# Patient Record
Sex: Female | Born: 1970 | Race: Black or African American | Hispanic: No | Marital: Single | Smoking: Never smoker
Health system: Southern US, Community
[De-identification: ages and names within clinical notes are randomized; demographics above are authoritative.]

## PROBLEM LIST (undated history)

## (undated) DIAGNOSIS — K2270 Barrett's esophagus without dysplasia: Secondary | ICD-10-CM

## (undated) DIAGNOSIS — Z5189 Encounter for other specified aftercare: Secondary | ICD-10-CM

## (undated) DIAGNOSIS — A599 Trichomoniasis, unspecified: Secondary | ICD-10-CM

## (undated) DIAGNOSIS — T7840XA Allergy, unspecified, initial encounter: Secondary | ICD-10-CM

## (undated) DIAGNOSIS — E119 Type 2 diabetes mellitus without complications: Secondary | ICD-10-CM

## (undated) DIAGNOSIS — Z9109 Other allergy status, other than to drugs and biological substances: Secondary | ICD-10-CM

## (undated) DIAGNOSIS — E876 Hypokalemia: Secondary | ICD-10-CM

## (undated) DIAGNOSIS — D509 Iron deficiency anemia, unspecified: Principal | ICD-10-CM

## (undated) DIAGNOSIS — I10 Essential (primary) hypertension: Secondary | ICD-10-CM

## (undated) DIAGNOSIS — D571 Sickle-cell disease without crisis: Secondary | ICD-10-CM

## (undated) DIAGNOSIS — D219 Benign neoplasm of connective and other soft tissue, unspecified: Secondary | ICD-10-CM

## (undated) DIAGNOSIS — K219 Gastro-esophageal reflux disease without esophagitis: Secondary | ICD-10-CM

## (undated) HISTORY — DX: Barrett's esophagus without dysplasia: K22.70

## (undated) HISTORY — DX: Trichomoniasis, unspecified: A59.9

## (undated) HISTORY — DX: Sickle-cell disease without crisis: D57.1

## (undated) HISTORY — PX: NASAL SEPTUM SURGERY: SHX37

## (undated) HISTORY — PX: TONSILLECTOMY AND ADENOIDECTOMY: SUR1326

## (undated) HISTORY — PX: POLYPECTOMY: SHX149

## (undated) HISTORY — DX: Hypokalemia: E87.6

## (undated) HISTORY — DX: Allergy, unspecified, initial encounter: T78.40XA

## (undated) HISTORY — DX: Benign neoplasm of connective and other soft tissue, unspecified: D21.9

## (undated) HISTORY — PX: CHOLECYSTECTOMY: SHX55

## (undated) HISTORY — PX: UPPER GASTROINTESTINAL ENDOSCOPY: SHX188

## (undated) HISTORY — DX: Gastro-esophageal reflux disease without esophagitis: K21.9

## (undated) HISTORY — PX: PALATE / UVULA BIOPSY / EXCISION: SUR128

## (undated) HISTORY — DX: Encounter for other specified aftercare: Z51.89

## (undated) HISTORY — DX: Other allergy status, other than to drugs and biological substances: Z91.09

## (undated) HISTORY — DX: Iron deficiency anemia, unspecified: D50.9

## (undated) HISTORY — PX: COLONOSCOPY: SHX174

---

## 2005-05-18 DIAGNOSIS — J302 Other seasonal allergic rhinitis: Secondary | ICD-10-CM | POA: Insufficient documentation

## 2006-09-13 DIAGNOSIS — G4700 Insomnia, unspecified: Secondary | ICD-10-CM | POA: Insufficient documentation

## 2008-11-29 DIAGNOSIS — Z79899 Other long term (current) drug therapy: Secondary | ICD-10-CM | POA: Insufficient documentation

## 2012-11-07 DIAGNOSIS — Z862 Personal history of diseases of the blood and blood-forming organs and certain disorders involving the immune mechanism: Secondary | ICD-10-CM | POA: Insufficient documentation

## 2012-12-07 DIAGNOSIS — G8929 Other chronic pain: Secondary | ICD-10-CM | POA: Insufficient documentation

## 2012-12-07 DIAGNOSIS — I10 Essential (primary) hypertension: Secondary | ICD-10-CM | POA: Insufficient documentation

## 2013-05-05 DIAGNOSIS — E785 Hyperlipidemia, unspecified: Secondary | ICD-10-CM | POA: Insufficient documentation

## 2013-06-06 ENCOUNTER — Encounter (HOSPITAL_COMMUNITY): Payer: Self-pay | Admitting: Emergency Medicine

## 2013-06-06 ENCOUNTER — Emergency Department (HOSPITAL_COMMUNITY)
Admission: EM | Admit: 2013-06-06 | Discharge: 2013-06-07 | Disposition: A | Discharge status: Home / Self Care | Payer: BC Managed Care – PPO | Attending: Emergency Medicine | Admitting: Emergency Medicine

## 2013-06-06 DIAGNOSIS — R109 Unspecified abdominal pain: Secondary | ICD-10-CM

## 2013-06-06 DIAGNOSIS — Z3202 Encounter for pregnancy test, result negative: Secondary | ICD-10-CM | POA: Insufficient documentation

## 2013-06-06 DIAGNOSIS — R1012 Left upper quadrant pain: Secondary | ICD-10-CM | POA: Insufficient documentation

## 2013-06-06 DIAGNOSIS — E119 Type 2 diabetes mellitus without complications: Secondary | ICD-10-CM | POA: Insufficient documentation

## 2013-06-06 DIAGNOSIS — I10 Essential (primary) hypertension: Secondary | ICD-10-CM | POA: Insufficient documentation

## 2013-06-06 DIAGNOSIS — D649 Anemia, unspecified: Secondary | ICD-10-CM

## 2013-06-06 DIAGNOSIS — E876 Hypokalemia: Secondary | ICD-10-CM

## 2013-06-06 DIAGNOSIS — Z79899 Other long term (current) drug therapy: Secondary | ICD-10-CM | POA: Insufficient documentation

## 2013-06-06 HISTORY — DX: Essential (primary) hypertension: I10

## 2013-06-06 HISTORY — DX: Type 2 diabetes mellitus without complications: E11.9

## 2013-06-06 LAB — CBC WITH DIFFERENTIAL/PLATELET
Basophils Relative: 0 % (ref 0–1)
Eosinophils Relative: 1 % (ref 0–5)
Hemoglobin: 8.1 g/dL — ABNORMAL LOW (ref 12.0–15.0)
Lymphocytes Relative: 15 % (ref 12–46)
Neutrophils Relative %: 78 % — ABNORMAL HIGH (ref 43–77)
RBC: 4.65 MIL/uL (ref 3.87–5.11)

## 2013-06-06 LAB — COMPREHENSIVE METABOLIC PANEL
ALT: 11 U/L (ref 0–35)
AST: 16 U/L (ref 0–37)
CO2: 28 mEq/L (ref 19–32)
Chloride: 98 mEq/L (ref 96–112)
GFR calc non Af Amer: >90 mL/min (ref >90)
Sodium: 139 mEq/L (ref 135–145)
Total Bilirubin: 0.6 mg/dL (ref 0.3–1.2)

## 2013-06-06 LAB — URINE MICROSCOPIC-ADD ON

## 2013-06-06 LAB — URINALYSIS, ROUTINE W REFLEX MICROSCOPIC
Bilirubin Urine: NEGATIVE
Protein, ur: 30 mg/dL — AB
Specific Gravity, Urine: 1.014 (ref 1.005–1.030)
Urobilinogen, UA: 0.2 mg/dL (ref 0.0–1.0)

## 2013-06-06 MED ORDER — ONDANSETRON HCL 4 MG/2ML IJ SOLN
4.0000 mg | Freq: Once | INTRAMUSCULAR | Status: AC
  Administered 2013-06-07: 4 mg via INTRAVENOUS
  Filled 2013-06-06: qty 2

## 2013-06-06 MED ORDER — MORPHINE SULFATE 4 MG/ML IJ SOLN
4.0000 mg | Freq: Once | INTRAMUSCULAR | Status: AC
  Administered 2013-06-07: 4 mg via INTRAVENOUS
  Filled 2013-06-06: qty 1

## 2013-06-06 MED ORDER — POTASSIUM CHLORIDE CRYS ER 20 MEQ PO TBCR
40.0000 meq | EXTENDED_RELEASE_TABLET | Freq: Once | ORAL | Status: AC
  Administered 2013-06-07: 40 meq via ORAL
  Filled 2013-06-06: qty 2

## 2013-06-06 NOTE — ED Notes (Signed)
Acuity increased due to Hemoglobin of 8.1 and Potassium of 2.6

## 2013-06-06 NOTE — ED Notes (Signed)
Pt.reports LLQ pain with with nausea and constipation onset 4 days ago , denies emesis or diarrhea , no fever or chills.

## 2013-06-07 ENCOUNTER — Emergency Department (HOSPITAL_COMMUNITY): Payer: BC Managed Care – PPO

## 2013-06-07 MED ORDER — DICYCLOMINE HCL 20 MG PO TABS: 20.0000 mg | ORAL_TABLET | Freq: Four times a day (QID) | ORAL | Status: DC | PRN

## 2013-06-07 MED ORDER — ONDANSETRON 4 MG PO TBDP: 4.0000 mg | ORAL_TABLET | Freq: Three times a day (TID) | ORAL | Status: DC | PRN

## 2013-06-07 MED ORDER — IOHEXOL 300 MG/ML  SOLN
25.0000 mL | INTRAMUSCULAR | Status: AC
  Administered 2013-06-07: 25 mL via ORAL

## 2013-06-07 MED ORDER — IOHEXOL 300 MG/ML  SOLN
100.0000 mL | Freq: Once | INTRAMUSCULAR | Status: AC | PRN
  Administered 2013-06-07: 100 mL via INTRAVENOUS

## 2013-06-07 MED ORDER — POTASSIUM CHLORIDE ER 10 MEQ PO TBCR: 20.0000 meq | EXTENDED_RELEASE_TABLET | Freq: Two times a day (BID) | ORAL | Status: DC

## 2013-06-07 NOTE — ED Notes (Signed)
Patient presents with a 5 day hx of left sided abdominal pain. Denies any diarrhea, constipation, nausea or vomiting. Patient states that it is not abnormal to have pain during her menses, in which she is currently at the end of, however; this pain is different. Pain occurs intermittently. Denies any fevers, sweats or chills. No hx abdominal surgery

## 2013-06-07 NOTE — ED Notes (Signed)
Patient reports hx sickle cell trait and low potassium. States that a HgB of 8 is normal for her.

## 2013-06-07 NOTE — ED Notes (Signed)
Patient transported to CT 

## 2013-06-07 NOTE — ED Provider Notes (Addendum)
CSN: [id number]     Arrival date & time 06/06/13  2113 History   First MD Initiated Contact with Patient 06/06/13 2258     Chief Complaint  Patient presents with  . Abdominal Pain   (Consider location/radiation/quality/duration/timing/severity/associated sxs/prior Treatment) HPI 42 yo female presents to the ER from home with complaint of left sided abdominal pain for 5 days.  Pain is intermittent, sharp, and crampy.  Pain is not like her menstrual cramps, is more severe and does not improve with ibuprofen.  Pt reports she has chronic nausea, intermittent diarrhea.  She has had some problems with having BM for the last few days, but today had firm formed bm, though she did have to strain some.  No fevers, chills, vomiting.  Pt reports past history of ovarian cyst.  She is currently on her menses.  No prior abdominal surgeries.  Past Medical History  Diagnosis Date  . Hypertension   . Diabetes mellitus without complication    History reviewed. No pertinent past surgical history. No family history on file. History  Substance Use Topics  . Smoking status: Never Smoker   . Smokeless tobacco: Not on file  . Alcohol Use: No   OB History   Grav Para Term Preterm Abortions TAB SAB Ect Mult Living                 Review of Systems  All other systems reviewed and are negative.    Allergies  Review of patient's allergies indicates no known allergies.  Home Medications   Current Outpatient Rx  Name  Route  Sig  Dispense  Refill  . cetirizine (ZYRTEC) 10 MG tablet   Oral   Take 10 mg by mouth daily.         . hydrochlorothiazide (HYDRODIURIL) 12.5 MG tablet   Oral   Take 12.5 mg by mouth daily.         Marland Kitchen ibuprofen (ADVIL,MOTRIN) 800 MG tablet   Oral   Take 800 mg by mouth every 8 (eight) hours as needed for pain.         . metFORMIN (GLUCOPHAGE) 500 MG tablet   Oral   Take 500 mg by mouth daily with breakfast.         . simvastatin (ZOCOR) 10 MG tablet   Oral  Take 10 mg by mouth daily.          BP 115/62  Pulse 90  Temp(Src) 99.6 F (37.6 C) (Oral)  Resp 14  SpO2 96%  LMP 05/31/2013 Physical Exam  Nursing note and vitals reviewed. Constitutional: She is oriented to person, place, and time. She appears well-developed and well-nourished.  HENT:  Head: Normocephalic and atraumatic.  Nose: Nose normal.  Mouth/Throat: Oropharynx is clear and moist.  Eyes: Conjunctivae and EOM are normal. Pupils are equal, round, and reactive to light.  Neck: Normal range of motion. Neck supple. No JVD present. No tracheal deviation present. No thyromegaly present.  Cardiovascular: Normal rate, regular rhythm, normal heart sounds and intact distal pulses.  Exam reveals no gallop and no friction rub.   No murmur heard. Pulmonary/Chest: Effort normal and breath sounds normal. No stridor. No respiratory distress. She has no wheezes. She has no rales. She exhibits no tenderness.  Abdominal: Soft. Bowel sounds are normal. She exhibits no distension and no mass. There is tenderness (LUQ, Left mid abd tenderness, no rebound, guarding). There is no rebound and no guarding.  Musculoskeletal: Normal range of motion. She exhibits no  edema and no tenderness.  Lymphadenopathy:    She has no cervical adenopathy.  Neurological: She is alert and oriented to person, place, and time. She has normal reflexes. No cranial nerve deficit. She exhibits normal muscle tone. Coordination normal.  Skin: Skin is warm and dry. No rash noted. No erythema. No pallor.  Psychiatric: She has a normal mood and affect. Her behavior is normal. Judgment and thought content normal.    ED Course  Procedures (including critical care time) Labs Review Labs Reviewed  CBC WITH DIFFERENTIAL - Abnormal; Notable for the following:    WBC 12.6 (*)    Hemoglobin 8.1 (*)    HCT 28.5 (*)    MCV 61.3 (*)    MCH 17.4 (*)    MCHC 28.4 (*)    RDW 17.5 (*)    Platelets 508 (*)    Neutrophils Relative %  78 (*)    Neutro Abs 9.8 (*)    All other components within normal limits  COMPREHENSIVE METABOLIC PANEL - Abnormal; Notable for the following:    Potassium 2.6 (*)    Glucose, Bld 168 (*)    All other components within normal limits  URINALYSIS, ROUTINE W REFLEX MICROSCOPIC - Abnormal; Notable for the following:    Hgb urine dipstick MODERATE (*)    Protein, ur 30 (*)    All other components within normal limits  URINE MICROSCOPIC-ADD ON  POCT PREGNANCY, URINE   Imaging Review Ct Abdomen Pelvis W Contrast  06/07/2013   *RADIOLOGY REPORT*  Clinical Data: Left-sided abdominal pain and leukocytosis.  Concern for colitis.  CT ABDOMEN AND PELVIS WITH CONTRAST  Technique:  Multidetector CT imaging of the abdomen and pelvis was performed following the standard protocol during bolus administration of intravenous contrast.  Contrast: OMNIPAQUE IOHEXOL 300 MG/ML  SOLN  Comparison: 01/09/2008.  Findings:  BODY WALL: Unremarkable.  LOWER CHEST:  Mediastinum: Unremarkable.  Lungs/pleura: No consolidation.  ABDOMEN/PELVIS:  Liver: No focal abnormality.  Biliary: Cholelithiasis.  No evidence of acute cholecystitis.  Pancreas: Unremarkable.  Spleen: Unremarkable.  Adrenals: Unremarkable.  Kidneys and ureters: No hydronephrosis or stone.  Bladder: Unremarkable.  Bowel: No obstruction. Normal appendix.  Retroperitoneum: No mass or adenopathy.  Peritoneum: No free fluid or gas.  Reproductive: 2cm predominantly hyperenhancing mass in the ventral uterus ( which is displaced to the right and the) most consistent with fibroid.  Symmetrically sized ovaries.  Vascular: No acute abnormality.  OSSEOUS: No acute abnormalities. No suspicious lytic or blastic lesions.  IMPRESSION: 1. No acute intra-abdominal findings.  No evidence for colitis. 2.  Cholelithiasis without acute cholecystitis. 3.  Small uterine fibroid.   Original Report Authenticated By: Tiburcio Pea    MDM   1. Abdominal pain   2. Anemia   3.  Hypokalemia     42 yo female with 5 days left-sided abd pain.  No pelvic pain on exam.  Slight elevation in wbc (pt reports chronic anemia and hypokalemia).  Concern for colitis/diverticulitis, will get ct abd pelvis.  2:22 AM No specific cause for abdominal pain found on CT scan.  Will prescribe bentyl, have patient f/u with pcm.  Olivia Mackie, MD 06/07/13 4098  Olivia Mackie, MD 06/07/13 [phone]

## 2013-09-08 DIAGNOSIS — G8929 Other chronic pain: Secondary | ICD-10-CM | POA: Insufficient documentation

## 2013-09-08 DIAGNOSIS — J45909 Unspecified asthma, uncomplicated: Secondary | ICD-10-CM | POA: Insufficient documentation

## 2013-09-18 ENCOUNTER — Telehealth: Payer: Self-pay | Admitting: Hematology and Oncology

## 2013-09-18 NOTE — Telephone Encounter (Signed)
C/D 09/18/13 for appt. 10/03/13

## 2013-10-03 ENCOUNTER — Telehealth: Payer: Self-pay | Admitting: *Deleted

## 2013-10-03 ENCOUNTER — Ambulatory Visit (HOSPITAL_BASED_OUTPATIENT_CLINIC_OR_DEPARTMENT_OTHER): Payer: BC Managed Care – PPO | Admitting: Hematology and Oncology

## 2013-10-03 ENCOUNTER — Encounter: Payer: Self-pay | Admitting: Hematology and Oncology

## 2013-10-03 ENCOUNTER — Ambulatory Visit: Payer: BC Managed Care – PPO

## 2013-10-03 ENCOUNTER — Telehealth: Payer: Self-pay | Admitting: Hematology and Oncology

## 2013-10-03 ENCOUNTER — Ambulatory Visit (HOSPITAL_BASED_OUTPATIENT_CLINIC_OR_DEPARTMENT_OTHER): Payer: BC Managed Care – PPO

## 2013-10-03 VITALS — BP 137/68 | HR 88 | Temp 98.3°F | Resp 20 | Ht 65.0 in | Wt 226.3 lb

## 2013-10-03 DIAGNOSIS — D509 Iron deficiency anemia, unspecified: Secondary | ICD-10-CM | POA: Insufficient documentation

## 2013-10-03 DIAGNOSIS — D508 Other iron deficiency anemias: Secondary | ICD-10-CM

## 2013-10-03 DIAGNOSIS — D473 Essential (hemorrhagic) thrombocythemia: Secondary | ICD-10-CM

## 2013-10-03 DIAGNOSIS — E876 Hypokalemia: Secondary | ICD-10-CM

## 2013-10-03 HISTORY — DX: Iron deficiency anemia, unspecified: D50.9

## 2013-10-03 HISTORY — DX: Hypokalemia: E87.6

## 2013-10-03 LAB — BASIC METABOLIC PANEL (CC13)
CO2: 26 mEq/L (ref 22–29)
Chloride: 98 mEq/L (ref 98–109)
Potassium: 2.9 mEq/L — CL (ref 3.5–5.1)
Sodium: 139 mEq/L (ref 136–145)

## 2013-10-03 LAB — CBC & DIFF AND RETIC
BASO%: 0.4 % (ref 0.0–2.0)
HCT: 30.1 % — ABNORMAL LOW (ref 34.8–46.6)
Immature Retic Fract: 15.8 % — ABNORMAL HIGH (ref 1.60–10.00)
MCHC: 29.3 g/dL — ABNORMAL LOW (ref 31.5–36.0)
MONO#: 0.7 10*3/uL (ref 0.1–0.9)
NEUT%: 76.6 % (ref 38.4–76.8)
RDW: 18.8 % — ABNORMAL HIGH (ref 11.2–14.5)
Retic %: 1.42 % (ref 0.70–2.10)
Retic Ct Abs: 73.27 10*3/uL (ref 33.70–90.70)
WBC: 11.4 10*3/uL — ABNORMAL HIGH (ref 3.9–10.3)
lymph#: 1.6 10*3/uL (ref 0.9–3.3)

## 2013-10-03 LAB — IRON AND TIBC CHCC: Iron: 23 ug/dL — ABNORMAL LOW (ref 41–142)

## 2013-10-03 LAB — SEDIMENTATION RATE: Sed Rate: 13 mm/hr (ref 0–22)

## 2013-10-03 LAB — HOLD TUBE, BLOOD BANK

## 2013-10-03 MED ORDER — POTASSIUM CHLORIDE CRYS ER 20 MEQ PO TBCR: 20.0000 meq | EXTENDED_RELEASE_TABLET | Freq: Two times a day (BID) | ORAL | Status: DC

## 2013-10-03 NOTE — Progress Notes (Signed)
[facility] CONSULT NOTE  Patient Care Team: Pcp Not In System as PCP - General  CHIEF COMPLAINTS/PURPOSE OF CONSULTATION:  Severe, chronic iron deficiency anemia, unable to tolerate oral iron supplement  HISTORY OF PRESENTING ILLNESS:  Priscilla Solis 42 y.o. female is here because of severe iron deficiency anemia  She was found to have abnormal CBC from routine physical. She denies recent chest pain on exertion, shortness of breath on minimal exertion, pre-syncopal episodes, or palpitations. She does complain of profound fatigue and takes naps all the time She had not noticed any recent bleeding such as epistaxis, hematuria or hematochezia The patient takes over the counter NSAID ingestion usually around the time of her menstrual cycle to treat premenstrual cramping. She is not on antiplatelets agents. She had no prior history or diagnosis of cancer. Her age appropriate screening programs are up-to-date. She has significant pica and chews a lot of ice regularly "by the pound". She eats a variety of diet. She never donated blood or received blood transfusion The patient was prescribed oral iron supplements and she takes them in the past but it caused significant constipation and she refuse to take further iron supplement the patient continues to have regular menstruation. Previously she has excessive bleeding but recently menstrual period has been light. She had 1 pregnancy and was told she was anemic during pregnancy  MEDICAL HISTORY:  Past Medical History  Diagnosis Date  . Hypertension   . Diabetes mellitus without complication   . Sickle cell anemia     sickle cell trait  . Microcytic anemia 10/03/2013    SURGICAL HISTORY: Past Surgical History  Procedure Laterality Date  . Tonsillectomy and adenoidectomy    . Nasal septum surgery      SOCIAL HISTORY: History   Social History  . Marital Status: Single    Spouse Name: N/A    Number of Children: N/A  .  Years of Education: N/A   Occupational History  . Not on file.   Social History Main Topics  . Smoking status: Never Smoker   . Smokeless tobacco: Never Used  . Alcohol Use: No  . Drug Use: No  . Sexual Activity: Not on file   Other Topics Concern  . Not on file   Social History Narrative  . No narrative on file    FAMILY HISTORY: Family History  Problem Relation Age of Onset  . Cancer Mother     Non invasive breast ca  . Cancer Cousin     ovarian cancer    ALLERGIES:  has No Known Allergies.  MEDICATIONS:  Current Outpatient Prescriptions  Medication Sig Dispense Refill  . cetirizine (ZYRTEC) 10 MG tablet Take 10 mg by mouth daily.      . hydrochlorothiazide (HYDRODIURIL) 12.5 MG tablet Take 12.5 mg by mouth daily.      Marland Kitchen ibuprofen (ADVIL,MOTRIN) 800 MG tablet Take 800 mg by mouth every 8 (eight) hours as needed for pain.      . metFORMIN (GLUCOPHAGE) 500 MG tablet Take 500 mg by mouth daily with breakfast.      . simvastatin (ZOCOR) 10 MG tablet Take 10 mg by mouth daily.      Marland Kitchen atenolol-chlorthalidone (TENORETIC) 50-25 MG per tablet       . dicyclomine (BENTYL) 20 MG tablet Take 1 tablet (20 mg total) by mouth every 6 (six) hours as needed (for abdominal cramping).  20 tablet  0  . ondansetron (ZOFRAN ODT) 4 MG disintegrating tablet  Take 1 tablet (4 mg total) by mouth every 8 (eight) hours as needed for nausea.  20 tablet  0  . oxyCODONE-acetaminophen (PERCOCET) 10-325 MG per tablet       . potassium chloride (K-DUR) 10 MEQ tablet Take 2 tablets (20 mEq total) by mouth 2 (two) times daily.  30 tablet  0  . ranitidine (ZANTAC) 300 MG tablet        No current facility-administered medications for this visit.    REVIEW OF SYSTEMS:   Constitutional: Denies fevers, chills or abnormal night sweats Eyes: Denies blurriness of vision, double vision or watery eyes Ears, nose, mouth, throat, and face: Denies mucositis or sore throat Respiratory: Denies cough, dyspnea or  wheezes Cardiovascular: Denies palpitation, chest discomfort or lower extremity swelling Gastrointestinal:  Denies nausea, heartburn or change in bowel habits Skin: Denies abnormal skin rashes Lymphatics: Denies new lymphadenopathy or easy bruising Neurological:Denies numbness, tingling or new weaknesses Behavioral/Psych: Mood is stable, no new changes  All other systems were reviewed with the patient and are negative.  PHYSICAL EXAMINATION: ECOG PERFORMANCE STATUS: 1 - Symptomatic but completely ambulatory  Filed Vitals:   10/03/13 0915  BP: 137/68  Pulse: 88  Temp: 98.3 F (36.8 C)  Resp: 20   Filed Weights   10/03/13 0915  Weight: 226 lb 4.8 oz (102.649 kg)    GENERAL:alert, no distress and comfortable. She is morbidly obese  SKIN: skin color is pale, texture, turgor are normal, no rashes or significant lesions EYES: normal, conjunctiva are  pale and non-injected, sclera clear OROPHARYNX:no exudate, no erythema and lips, buccal mucosa, and tongue normal  NECK: supple, thyroid normal size, non-tender, without nodularity LYMPH:  no palpable lymphadenopathy in the cervical, axillary or inguinal LUNGS: clear to auscultation and percussion with normal breathing effort HEART: regular rate & rhythm and no murmurs and no lower extremity edema ABDOMEN:abdomen soft, non-tender and normal bowel sounds Musculoskeletal:no cyanosis of digits and no clubbing  PSYCH: alert & oriented x 3 with fluent speech NEURO: no focal motor/sensory deficits  LABORATORY DATA:  I have reviewed the data as listed. Her last hemoglobin was in September which shows she is severely anemic.  ASSESSMENT:  Severe iron deficiency anemia  PLAN #1 Anemia The cause of her anemia is due to severe iron deficiency, likely due to history of severe menorrhagia. The patient cannot tolerate oral iron supplement due to severe side effects. I ordered a stat CBC in my office today which show her hemoglobin is greater  than 8 g and she does not require blood transfusion We discussed some of the risks, benefits, and alternatives of intravenous iron infusions. The patient is symptomatic from anemia and the iron level is critically low. She tolerated oral iron supplement poorly and desires to achieved higher levels of iron faster for adequate hematopoesis. Some of the side-effects to be expected including risks of infusion reactions, phlebitis, headaches, nausea and fatigue.  The patient is willing to proceed. Patient education material was dispensed.  Goal is to keep ferritin level greater than 50 I will see her back in about a month. If her ferritin improves to normal, and I plan to see her again to follow and hopefully her hemoglobin does not drop further and her ferritin level stays up. If despite iron infusion she continue to have persistent iron deficiency, she may need a colonoscopy and EGD in the near future. I rather hold off until she has received adequate iron replacement. Her most recent CT scan of  the abdomen was negative. #2 thrombocytosis This is due to his severe iron deficiency. We will monitor for now. #3 hypokalemia This is likely due to her recent diuretic treatment. I will replace it with oral supplement.

## 2013-10-03 NOTE — Telephone Encounter (Signed)
gv and printed appt sched and avs for pt for DEc and Jan sed add tx....sent pt to lab

## 2013-10-03 NOTE — Telephone Encounter (Signed)
Message copied by Rolanda Jay on Tue Oct 03, 2013 11:29 AM ------      Message from: [facility], [place]      Created: Tue Oct 03, 2013 11:06 AM      Regarding: hypokalemia       I e scribed K supplement to her pharmacy      Please call and let her know      She also need to discuss with her PCP about long term K supplement       ----- Message -----         From: [facility]         Sent: 10/03/2013  10:28 AM           To: Artis Delay, MD                   ------

## 2013-10-03 NOTE — Telephone Encounter (Signed)
Informed pt of low Potassium level and need for replacement,  rx sent to [facility] for pt to take 20 Meq twice daily.   Pt states not currently taking any potassium and verbalized understanding of taking new rx and will also f/u w/ PCP for low potassium.  Faxed office note and labs from today to Dr. Weston Brass at fax #[phone].

## 2013-10-06 ENCOUNTER — Other Ambulatory Visit: Payer: Self-pay | Admitting: Hematology and Oncology

## 2013-10-06 ENCOUNTER — Ambulatory Visit (HOSPITAL_BASED_OUTPATIENT_CLINIC_OR_DEPARTMENT_OTHER): Payer: BC Managed Care – PPO

## 2013-10-06 VITALS — BP 116/72 | HR 84 | Temp 99.0°F | Resp 20

## 2013-10-06 DIAGNOSIS — D509 Iron deficiency anemia, unspecified: Secondary | ICD-10-CM

## 2013-10-06 MED ORDER — SODIUM CHLORIDE 0.9 % IV SOLN
1020.0000 mg | Freq: Once | INTRAVENOUS | Status: AC
  Administered 2013-10-06: 1020 mg via INTRAVENOUS
  Filled 2013-10-06: qty 34

## 2013-10-06 MED ORDER — SODIUM CHLORIDE 0.9 % IV SOLN
Freq: Once | INTRAVENOUS | Status: AC
  Administered 2013-10-06: 09:00:00 via INTRAVENOUS

## 2013-10-06 NOTE — Patient Instructions (Signed)

## 2013-10-09 ENCOUNTER — Telehealth: Payer: Self-pay | Admitting: Hematology and Oncology

## 2013-10-09 NOTE — Telephone Encounter (Signed)
I spoke with the patient today. She is complaining of nausea and dry heaves which I suspect was due to potassium supplement. I recommend the patient to stop the potassium supplement and to take fruit juices which is typically rich with potassium. She is a complaining of some low-grade temperature and hot flashes which is suspect is not related to iron infusion. I reassured the patient.

## 2013-10-24 ENCOUNTER — Ambulatory Visit (HOSPITAL_BASED_OUTPATIENT_CLINIC_OR_DEPARTMENT_OTHER): Payer: BC Managed Care – PPO | Admitting: Hematology and Oncology

## 2013-10-24 ENCOUNTER — Encounter: Payer: Self-pay | Admitting: Hematology and Oncology

## 2013-10-24 ENCOUNTER — Telehealth: Payer: Self-pay | Admitting: *Deleted

## 2013-10-24 ENCOUNTER — Other Ambulatory Visit (HOSPITAL_BASED_OUTPATIENT_CLINIC_OR_DEPARTMENT_OTHER): Payer: BC Managed Care – PPO

## 2013-10-24 VITALS — BP 128/70 | HR 76 | Temp 97.1°F | Resp 18 | Ht 65.0 in | Wt 230.7 lb

## 2013-10-24 DIAGNOSIS — E876 Hypokalemia: Secondary | ICD-10-CM

## 2013-10-24 DIAGNOSIS — N920 Excessive and frequent menstruation with regular cycle: Secondary | ICD-10-CM

## 2013-10-24 DIAGNOSIS — I809 Phlebitis and thrombophlebitis of unspecified site: Secondary | ICD-10-CM

## 2013-10-24 DIAGNOSIS — D509 Iron deficiency anemia, unspecified: Secondary | ICD-10-CM

## 2013-10-24 LAB — CBC & DIFF AND RETIC
BASO%: 0.1 % (ref 0.0–2.0)
Basophils Absolute: 0 10*3/uL (ref 0.0–0.1)
EOS ABS: 0.2 10*3/uL (ref 0.0–0.5)
EOS%: 1.8 % (ref 0.0–7.0)
HEMATOCRIT: 37.2 % (ref 34.8–46.6)
HEMOGLOBIN: 11.2 g/dL — AB (ref 11.6–15.9)
IMMATURE RETIC FRACT: 16.2 % — AB (ref 1.60–10.00)
LYMPH#: 1.6 10*3/uL (ref 0.9–3.3)
LYMPH%: 17.7 % (ref 14.0–49.7)
MCH: 21.2 pg — ABNORMAL LOW (ref 25.1–34.0)
MCHC: 30.1 g/dL — AB (ref 31.5–36.0)
MCV: 70.3 fL — ABNORMAL LOW (ref 79.5–101.0)
MONO#: 0.6 10*3/uL (ref 0.1–0.9)
MONO%: 6.7 % (ref 0.0–14.0)
NEUT%: 73.7 % (ref 38.4–76.8)
NEUTROS ABS: 6.6 10*3/uL — AB (ref 1.5–6.5)
PLATELETS: 411 10*3/uL — AB (ref 145–400)
RBC: 5.29 10*6/uL (ref 3.70–5.45)
RDW: 29.1 % — ABNORMAL HIGH (ref 11.2–14.5)
RETIC CT ABS: 129.61 10*3/uL — AB (ref 33.70–90.70)
Retic %: 2.45 % — ABNORMAL HIGH (ref 0.70–2.10)
WBC: 9 10*3/uL (ref 3.9–10.3)
nRBC: 0 % (ref 0–0)

## 2013-10-24 LAB — BASIC METABOLIC PANEL (CC13)
Anion Gap: 14 mEq/L — ABNORMAL HIGH (ref 3–11)
BUN: 14.7 mg/dL (ref 7.0–26.0)
CO2: 27 meq/L (ref 22–29)
Calcium: 9.6 mg/dL (ref 8.4–10.4)
Chloride: 100 mEq/L (ref 98–109)
Creatinine: 0.9 mg/dL (ref 0.6–1.1)
Glucose: 169 mg/dl — ABNORMAL HIGH (ref 70–140)
POTASSIUM: 3 meq/L — AB (ref 3.5–5.1)
SODIUM: 141 meq/L (ref 136–145)

## 2013-10-24 LAB — FERRITIN CHCC: FERRITIN: 93 ng/mL (ref 9–269)

## 2013-10-24 NOTE — Telephone Encounter (Signed)
Message copied by Cathlean Cower on Tue Oct 24, 2013  2:37 PM ------      Message from: [facility], [place]: Tue Oct 24, 2013 12:47 PM      Regarding: test results       Please inform pt      1) ferritin OK, no need iron until next visit      2) Low potassium: still low, need further work-up with PCP. Recommend she still take the Potassium supplement I prescribed last week      ----- Message -----         From: [facility]         Sent: 10/24/2013   9:00 AM           To: Heath Lark, MD                   ------

## 2013-10-24 NOTE — Progress Notes (Signed)
[place], MD DIAGNOSIS:  Severe iron deficiency anemia and thrombocytosis due to menorrhagia  SUMMARY OF HEMATOLOGIC HISTORY: This is a pleasant 43 year old lady who is being referred here because of severe iron deficiency anemia and intolerance to oral iron supplement. The cause of her iron deficiency anemia is likely due to excessive menstruation from uterine fibroids INTERVAL HISTORY: Priscilla Solis 43 y.o. female returns for  further followup. She tolerated intravenous iron well. Pica is improving. She is in the process of getting an appointment to see her gynecologist for management of menorrhagia. She complained of tenderness in the right upper arm at previous IV sites  I have reviewed the past medical history, past surgical history, social history and family history with the patient and they are unchanged from previous note.  ALLERGIES:  has No Known Allergies.  MEDICATIONS:  Current Outpatient Prescriptions  Medication Sig Dispense Refill  . atenolol-chlorthalidone (TENORETIC) 50-25 MG per tablet       . cetirizine (ZYRTEC) 10 MG tablet Take 10 mg by mouth daily.      . hydrochlorothiazide (HYDRODIURIL) 12.5 MG tablet Take 12.5 mg by mouth daily.      Marland Kitchen ibuprofen (ADVIL,MOTRIN) 800 MG tablet Take 800 mg by mouth every 8 (eight) hours as needed for pain.      . metFORMIN (GLUCOPHAGE) 500 MG tablet Take 500 mg by mouth daily with breakfast.      . oxyCODONE-acetaminophen (PERCOCET) 10-325 MG per tablet       . potassium chloride SA (K-DUR,KLOR-CON) 20 MEQ tablet Take 1 tablet (20 mEq total) by mouth 2 (two) times daily.  14 tablet  0  . ranitidine (ZANTAC) 300 MG tablet       . simvastatin (ZOCOR) 10 MG tablet Take 10 mg by mouth daily.       No current facility-administered medications for this visit.     REVIEW OF SYSTEMS:   Behavioral/Psych: Mood is stable, no new changes  All other systems were reviewed with the  patient and are negative.  PHYSICAL EXAMINATION: ECOG PERFORMANCE STATUS: 0 - Asymptomatic  Filed Vitals:   10/24/13 0853  BP: 128/70  Pulse: 76  Temp: 97.1 F (36.2 C)  Resp: 18   Filed Weights   10/24/13 0853  Weight: 230 lb 11.2 oz (104.645 kg)    GENERAL:alert, no distress and comfortable Examination of her right upper extremity reveals some mild swelling and tenderness in the superficial vein likely represents superficial thrombophlebitis LABORATORY DATA:  I have reviewed the data as listed Results for orders placed in visit on 10/24/13 (from the past 48 hour(s))  CBC & DIFF AND RETIC     Status: Abnormal   Collection Time    10/24/13  8:41 AM      Result Value Range   WBC 9.0  3.9 - 10.3 10e3/uL   NEUT# 6.6 (*) 1.5 - 6.5 10e3/uL   HGB 11.2 (*) 11.6 - 15.9 g/dL   HCT 37.2  34.8 - 46.6 %   Platelets 411 (*) 145 - 400 10e3/uL   MCV 70.3 (*) 79.5 - 101.0 fL   MCH 21.2 (*) 25.1 - 34.0 pg   MCHC 30.1 (*) 31.5 - 36.0 g/dL   RBC 5.29  3.70 - 5.45 10e6/uL   RDW 29.1 (*) 11.2 - 14.5 %   lymph# 1.6  0.9 - 3.3 10e3/uL   MONO# 0.6  0.1 - 0.9 10e3/uL   Eosinophils Absolute 0.2  0.0 -  0.5 10e3/uL   Basophils Absolute 0.0  0.0 - 0.1 10e3/uL   NEUT% 73.7  38.4 - 76.8 %   LYMPH% 17.7  14.0 - 49.7 %   MONO% 6.7  0.0 - 14.0 %   EOS% 1.8  0.0 - 7.0 %   BASO% 0.1  0.0 - 2.0 %   nRBC 0  0 - 0 %   Retic % 2.45 (*) 0.70 - 2.10 %   Retic Ct Abs 129.61 (*) 33.70 - 90.70 10e3/uL   Immature Retic Fract 16.20 (*) 1.60 - 10.00 %    Lab Results  Component Value Date   WBC 9.0 10/24/2013   HGB 11.2* 10/24/2013   HCT 37.2 10/24/2013   MCV 70.3* 10/24/2013   PLT 411* 10/24/2013  ASSESSMENT & PLAN:  #1 severe iron deficiency anemia This is due to heavy menstruation. I recommend her to followup with a gynecologist for management of this. Her anemia is improving. Ferritin level is pending. I prefer to get her ferritin level above 100 if possible and I will call the patient's results. If  further iron infusion is needed I will set it up. However if further iron infusion is not  needed, I will bring her back in my office in 6 months with repeat blood work and iron studies. #2 menorrhagia I would defer to her gynecologist for further management #3 superficial thrombophlebitis I recommend conservative management and observation only #4 hypokalemia The potassium supplement was making her sick. She has discontinue both hydrochlorothiazide and potassium supplement. Result from today is still pending and I will call the patient will test results. In the meantime I recommend she stay away from her diuretics as I suspect that is the most likely cause of her hypokalemia. All questions were answered. The patient knows to call the clinic with any problems, questions or concerns. No barriers to learning was detected.  I spent 15 minutes counseling the patient face to face. The total time spent in the appointment was 20 minutes and more than 50% was on counseling.     [place], [place], MD 10/24/2013 9:16 AM

## 2013-10-24 NOTE — Telephone Encounter (Signed)
Left pt VM requesting she call nurse for message (see below) from Dr. Alvy Bimler.

## 2013-10-25 ENCOUNTER — Telehealth: Payer: Self-pay | Admitting: Hematology and Oncology

## 2013-10-25 NOTE — Telephone Encounter (Signed)
Informed pt of Dr. Calton Dach note that Ferritin is ok and no need for iron until next visit.   Also informed of Potassium still low and to continue potassium supplement as prescribed and to f/u w/ PCP regarding low potassium level.  Pt verbalized understanding.

## 2013-10-25 NOTE — Telephone Encounter (Signed)
Pt left VM this morning returning nurse's call.  Called pt back and left VM for pt asking her to return call again.

## 2013-10-25 NOTE — Telephone Encounter (Signed)
lmonvm for pt re appts for 7/15 and 7/22. schedule mailed.

## 2014-04-18 ENCOUNTER — Telehealth: Payer: Self-pay | Admitting: *Deleted

## 2014-04-18 ENCOUNTER — Other Ambulatory Visit (HOSPITAL_BASED_OUTPATIENT_CLINIC_OR_DEPARTMENT_OTHER): Payer: BC Managed Care – PPO

## 2014-04-18 DIAGNOSIS — D509 Iron deficiency anemia, unspecified: Secondary | ICD-10-CM

## 2014-04-18 DIAGNOSIS — E876 Hypokalemia: Secondary | ICD-10-CM

## 2014-04-18 LAB — CBC & DIFF AND RETIC
BASO%: 0.1 % (ref 0.0–2.0)
Basophils Absolute: 0 10*3/uL (ref 0.0–0.1)
EOS%: 3.4 % (ref 0.0–7.0)
Eosinophils Absolute: 0.4 10*3/uL (ref 0.0–0.5)
HEMATOCRIT: 33 % — AB (ref 34.8–46.6)
HGB: 10.2 g/dL — ABNORMAL LOW (ref 11.6–15.9)
Immature Retic Fract: 11.7 % — ABNORMAL HIGH (ref 1.60–10.00)
LYMPH%: 17.8 % (ref 14.0–49.7)
MCH: 21.3 pg — AB (ref 25.1–34.0)
MCHC: 30.9 g/dL — AB (ref 31.5–36.0)
MCV: 68.9 fL — ABNORMAL LOW (ref 79.5–101.0)
MONO#: 0.8 10*3/uL (ref 0.1–0.9)
MONO%: 6.9 % (ref 0.0–14.0)
NEUT#: 8.3 10*3/uL — ABNORMAL HIGH (ref 1.5–6.5)
NEUT%: 71.8 % (ref 38.4–76.8)
Platelets: 346 10*3/uL (ref 145–400)
RBC: 4.79 10*6/uL (ref 3.70–5.45)
RDW: 16.6 % — AB (ref 11.2–14.5)
RETIC CT ABS: 85.74 10*3/uL (ref 33.70–90.70)
Retic %: 1.79 % (ref 0.70–2.10)
WBC: 11.5 10*3/uL — ABNORMAL HIGH (ref 3.9–10.3)
lymph#: 2 10*3/uL (ref 0.9–3.3)
nRBC: 0 % (ref 0–0)

## 2014-04-18 LAB — BASIC METABOLIC PANEL (CC13)
ANION GAP: 11 meq/L (ref 3–11)
BUN: 13.1 mg/dL (ref 7.0–26.0)
CO2: 27 mEq/L (ref 22–29)
Calcium: 9.2 mg/dL (ref 8.4–10.4)
Chloride: 102 mEq/L (ref 98–109)
Creatinine: 0.9 mg/dL (ref 0.6–1.1)
Glucose: 119 mg/dl (ref 70–140)
POTASSIUM: 2.8 meq/L — AB (ref 3.5–5.1)
Sodium: 140 mEq/L (ref 136–145)

## 2014-04-18 NOTE — Telephone Encounter (Signed)
Pt states she stopped taking Potassium one month ago because her PCP told her to stop.  She says her potassium was fine one month ago.  Informed pt it is low today at 2.8 and Dr. Alvy Bimler can prescribe her potassium.  Pt states still has potassium pills at home and she will start taking them again. Instructed her to follow up w/ her PCP to recheck w/i next month.  Also reviewed some foods high in potassium such as potatoes, orange juice, bananas, etc.. Pt verbalized understanding.

## 2014-04-18 NOTE — Telephone Encounter (Signed)
Message copied by Cathlean Cower on Wed Apr 18, 2014  4:15 PM ------      Message from: [facility], [place]      Created: Wed Apr 18, 2014  3:57 PM      Regarding: hypokalemia       Please call pt and make sure she takes K supplement      ----- Message -----         From: [facility]         Sent: 04/18/2014   3:21 PM           To: Heath Lark, MD                   ------

## 2014-04-19 ENCOUNTER — Telehealth: Payer: Self-pay | Admitting: *Deleted

## 2014-04-19 ENCOUNTER — Telehealth: Payer: Self-pay | Admitting: Hematology and Oncology

## 2014-04-19 ENCOUNTER — Other Ambulatory Visit: Payer: Self-pay | Admitting: *Deleted

## 2014-04-19 LAB — FERRITIN CHCC: Ferritin: 4 ng/ml — ABNORMAL LOW (ref 9–269)

## 2014-04-19 NOTE — Telephone Encounter (Signed)
Per new guidelines, her feraheme must be completed by 4 pm. Can she come in at 1 pm to see me and iv iron afterwards?

## 2014-04-19 NOTE — Telephone Encounter (Signed)
Left VM on cell phone asking that pt call Dr. Calton Dach nurse back regarding iron.

## 2014-04-19 NOTE — Telephone Encounter (Signed)
Message copied by Cathlean Cower on Thu Apr 19, 2014  2:38 PM ------      Message from: [facility], [place]: Thu Apr 19, 2014  9:16 AM      Regarding: needs IV iron       She will need IV iron again.      Can you ask if she can come sooner next week or another day so that I can see her and give IV iron same day? Would be iron feraheme      ----- Message -----         From: [facility]         Sent: 04/18/2014   3:21 PM           To: Heath Lark, MD                   ------

## 2014-04-19 NOTE — Telephone Encounter (Signed)
PER 7/16 POF CHANGED TIME OF 7/22 F/U TO 1PM AND ADDED INF. INTIALLY LM ON HOME PHONE BUT CALLED CELL AND S/W PT RE CHANGE.

## 2014-04-19 NOTE — Telephone Encounter (Signed)
Informed pt of low Ferritin level and IV iron recommended by Dr. Alvy Bimler early next week.  Pt states she can only come in on Wednesday,  Has day off work and asks for iron on Wed w/ office visit.  POF sent to add for IV Feraheme 7/22.    Pt states she thinks her iron is low because she was on her menstrual cycle and she complains her cycle gets even worse after taking iron.  Suggested pt discuss this w/ Dr. Alvy Bimler next week.  She verbalized understanding.

## 2014-04-25 ENCOUNTER — Ambulatory Visit (HOSPITAL_BASED_OUTPATIENT_CLINIC_OR_DEPARTMENT_OTHER): Payer: BC Managed Care – PPO | Admitting: Hematology and Oncology

## 2014-04-25 ENCOUNTER — Ambulatory Visit (HOSPITAL_BASED_OUTPATIENT_CLINIC_OR_DEPARTMENT_OTHER): Payer: BC Managed Care – PPO

## 2014-04-25 ENCOUNTER — Telehealth: Payer: Self-pay | Admitting: Hematology and Oncology

## 2014-04-25 ENCOUNTER — Encounter: Payer: Self-pay | Admitting: Hematology and Oncology

## 2014-04-25 VITALS — BP 119/75 | HR 101 | Temp 98.3°F

## 2014-04-25 VITALS — BP 119/70 | HR 80 | Temp 98.5°F | Resp 19 | Ht 66.0 in | Wt 243.3 lb

## 2014-04-25 DIAGNOSIS — E876 Hypokalemia: Secondary | ICD-10-CM

## 2014-04-25 DIAGNOSIS — D509 Iron deficiency anemia, unspecified: Secondary | ICD-10-CM

## 2014-04-25 MED ORDER — FERUMOXYTOL INJECTION 510 MG/17 ML
1020.0000 mg | Freq: Once | INTRAVENOUS | Status: AC
  Administered 2014-04-25: 1020 mg via INTRAVENOUS
  Filled 2014-04-25: qty 34

## 2014-04-25 MED ORDER — SODIUM CHLORIDE 0.9 % IJ SOLN
10.0000 mL | INTRAMUSCULAR | Status: DC | PRN
  Filled 2014-04-25: qty 10

## 2014-04-25 NOTE — Assessment & Plan Note (Signed)
She has recurrent iron deficiency anemia, likely due to menorrhagia. On questioning, she stated that she is not having any menorrhagia. She agrees to proceed with intravenous iron infusion today. I suggested GI evaluation for recurrent microcytic anemia. The patient wants to come back in a month to recheck her blood and to consider GI workup only if she has recurrence of iron deficiency. I think that is reasonable. I will order her hemoglobinopathy evaluation to exclude thalassemia as the cause of her microcytosis.

## 2014-04-25 NOTE — Progress Notes (Signed)
[place], [place] SUMMARY OF HEMATOLOGIC HISTORY: This is a pleasant 43 year old lady who is being referred here because of severe iron deficiency anemia and intolerance to oral iron supplement. The cause of her iron deficiency anemia is likely due to excessive menstruation from uterine fibroids. She received one dose of intravenous iron on 10/06/2013 INTERVAL HISTORY: Priscilla Solis 43 y.o. female returns for further followup. She denies excessive menstruation. The patient denies any recent signs or symptoms of bleeding such as spontaneous epistaxis, hematuria or hematochezia. She complained of fluid retention.  I have reviewed the past medical history, past surgical history, social history and family history with the patient and they are unchanged from previous note.  ALLERGIES:  has No Known Allergies.  MEDICATIONS:  Current Outpatient Prescriptions  Medication Sig Dispense Refill  . cetirizine (ZYRTEC) 10 MG tablet Take 10 mg by mouth daily.      . hydrochlorothiazide (HYDRODIURIL) 12.5 MG tablet Take 25 mg by mouth daily.       Marland Kitchen ibuprofen (ADVIL,MOTRIN) 800 MG tablet Take 800 mg by mouth every 8 (eight) hours as needed for pain.      Marland Kitchen losartan (COZAAR) 50 MG tablet Take 25 mg by mouth daily.      . metFORMIN (GLUCOPHAGE) 500 MG tablet Take 1,000 mg by mouth 2 (two) times daily with a meal.       . oxyCODONE-acetaminophen (PERCOCET) 10-325 MG per tablet       . potassium chloride SA (K-DUR,KLOR-CON) 20 MEQ tablet Take 20 mEq by mouth daily.      . ranitidine (ZANTAC) 300 MG tablet Take 300 mg by mouth at bedtime.       . simvastatin (ZOCOR) 10 MG tablet Take 10 mg by mouth daily.       No current facility-administered medications for this visit.     REVIEW OF SYSTEMS:   Constitutional: Denies fevers, chills or night sweats Eyes: Denies blurriness of vision Ears, nose, mouth, throat, and face: Denies mucositis or sore  throat Respiratory: Denies cough, dyspnea or wheezes Cardiovascular: Denies palpitation, chest discomfort or lower extremity swelling Gastrointestinal:  Denies nausea, heartburn or change in bowel habits Skin: Denies abnormal skin rashes Lymphatics: Denies new lymphadenopathy or easy bruising Neurological:Denies numbness, tingling or new weaknesses Behavioral/Psych: Mood is stable, no new changes  All other systems were reviewed with the patient and are negative.  PHYSICAL EXAMINATION: ECOG PERFORMANCE STATUS: 0 - Asymptomatic  Filed Vitals:   04/25/14 1322  BP: 119/70  Pulse: 80  Temp: 98.5 F (36.9 C)  Resp: 19   Filed Weights   04/25/14 1322  Weight: 243 lb 4.8 oz (110.36 kg)    GENERAL:alert, no distress and comfortable. She is morbidly obese SKIN: skin color, texture, turgor are normal, no rashes or significant lesions EYES: normal, Conjunctiva are pale and non-injected, sclera clear OROPHARYNX:no eMusculoskeletal:no cyanosis of digits and no clubbing  NEURO: alert & oriented x 3 with fluent speech, no focal motor/sensory deficits  LABORATORY DATA:  I have reviewed the data as listed No results found for this or any previous visit (from the past 48 hour(s)).  Lab Results  Component Value Date   WBC 11.5* 04/18/2014   HGB 10.2* 04/18/2014   HCT 33.0* 04/18/2014   MCV 68.9* 04/18/2014   PLT 346 04/18/2014    ASSESSMENT & PLAN:  Microcytic anemia She has recurrent iron deficiency anemia, likely due to menorrhagia. On questioning, she stated that she is  not having any menorrhagia. She agrees to proceed with intravenous iron infusion today. I suggested GI evaluation for recurrent microcytic anemia. The patient wants to come back in a month to recheck her blood and to consider GI workup only if she has recurrence of iron deficiency. I think that is reasonable. I will order her hemoglobinopathy evaluation to exclude thalassemia as the cause of her  microcytosis.   Hypokalemia This is due to her diuretic therapy. I recommend she discuss with her physician about prescribing another form of potassium sparing diuretic.    All questions were answered. The patient knows to call the clinic with any problems, questions or concerns. No barriers to learning was detected.  I spent 15 minutes counseling the patient face to face. The total time spent in the appointment was 20 minutes and more than 50% was on counseling.     [facility], [place], MD 04/25/2014 1:47 PM

## 2014-04-25 NOTE — Patient Instructions (Signed)

## 2014-04-25 NOTE — Assessment & Plan Note (Signed)
This is due to her diuretic therapy. I recommend she discuss with her physician about prescribing another form of potassium sparing diuretic.

## 2014-04-25 NOTE — Telephone Encounter (Signed)
Pt confirmed labs/ov per 07/22 POF, gave pt AVS...KJ °

## 2014-04-26 ENCOUNTER — Other Ambulatory Visit: Payer: Self-pay | Admitting: Hematology and Oncology

## 2014-04-26 ENCOUNTER — Telehealth: Payer: Self-pay | Admitting: *Deleted

## 2014-04-26 DIAGNOSIS — D509 Iron deficiency anemia, unspecified: Secondary | ICD-10-CM

## 2014-04-26 NOTE — Telephone Encounter (Signed)
Pt states she would like Dr Alvy Bimler to refer her to GI MD to rule GI bleed.

## 2014-04-26 NOTE — Telephone Encounter (Signed)
Order placed

## 2014-04-27 ENCOUNTER — Encounter: Payer: Self-pay | Admitting: Gastroenterology

## 2014-04-27 ENCOUNTER — Telehealth: Payer: Self-pay | Admitting: Hematology and Oncology

## 2014-04-27 NOTE — Telephone Encounter (Signed)
S.w. Pt and advised on 8.11 appt with PA Shane Crutch @# 9:30..the patient ok adn aware

## 2014-05-15 ENCOUNTER — Ambulatory Visit (INDEPENDENT_AMBULATORY_CARE_PROVIDER_SITE_OTHER): Payer: BC Managed Care – PPO | Admitting: Gastroenterology

## 2014-05-15 ENCOUNTER — Encounter: Payer: Self-pay | Admitting: Gastroenterology

## 2014-05-15 VITALS — BP 118/70 | HR 68 | Ht 65.0 in | Wt 238.4 lb

## 2014-05-15 DIAGNOSIS — D509 Iron deficiency anemia, unspecified: Secondary | ICD-10-CM

## 2014-05-15 DIAGNOSIS — K219 Gastro-esophageal reflux disease without esophagitis: Secondary | ICD-10-CM | POA: Insufficient documentation

## 2014-05-15 DIAGNOSIS — K625 Hemorrhage of anus and rectum: Secondary | ICD-10-CM

## 2014-05-15 MED ORDER — MOVIPREP 100 G PO SOLR: 1.0000 | Freq: Once | ORAL | Status: DC

## 2014-05-15 NOTE — Progress Notes (Addendum)
[id number] Abriella Filkins [id number] [birth year]   HISTORY OF PRESENT ILLNESS:  This is a 43 year old female who presents to our office today at the request of hematology, Dr. Alvy Bimler, in regards to [place].  Patient says that she has been anemic as long as she can remember.  Is still having menses but denies menorrhagia.  Has sickle cell trait.  Is intolerant to PO iron because it makes her sick.  She has received 2 iron infusions since December.  her most recent Hgb last month was 10.2 grams.  It had been as low as 8.1 grams just one year ago.  MCV is low at 68.9.  Ferritin is 4.  Other iron studies in 09/2013 indicated IDA as well.  Her stools have not been checked for blood.  She admits to seeing bright red blood on occasion with BM if she is constipated and has to strain.  No black stools.  She does take some ibuprofen prn, but usually just around the time of her cycle.  She admits to reflux issues for about 10 years or so and was previously on omeprazole and zantac.  She is now only taking zantac 300 mg at bedtime.  Overall it works ok for her, but she still gets some discomfort in her chest with reflux at times.     Past Medical History  Diagnosis Date  . Hypertension   . Diabetes mellitus without complication   . Sickle cell anemia     sickle cell trait  . Microcytic anemia 10/03/2013  . Hypokalemia 10/03/2013   Past Surgical History  Procedure Laterality Date  . Tonsillectomy and adenoidectomy    . Nasal septum surgery      reports that she has never smoked. She has never used smokeless tobacco. She reports that she does not drink alcohol or use illicit drugs. family history includes Cancer in her cousin and mother; Diabetes in her maternal aunt and maternal uncle. No Known Allergies    Outpatient Encounter Prescriptions as of 05/15/2014  Medication Sig  . budesonide-formoterol (SYMBICORT) 160-4.5 MCG/ACT inhaler Frequency:BID   Dosage:0.0     Instructions:  Note:Dose:  160-4.5MCG  . cetirizine (ZYRTEC) 10 MG tablet Take 10 mg by mouth daily.  . hydrochlorothiazide (HYDRODIURIL) 12.5 MG tablet Take 25 mg by mouth daily.   Marland Kitchen ibuprofen (ADVIL,MOTRIN) 800 MG tablet Take 800 mg by mouth every 8 (eight) hours as needed for pain.  Marland Kitchen losartan (COZAAR) 50 MG tablet Take 25 mg by mouth daily.  . metFORMIN (GLUCOPHAGE) 500 MG tablet Take 1,000 mg by mouth 2 (two) times daily with a meal.   . oxyCODONE-acetaminophen (PERCOCET) 10-325 MG per tablet   . potassium chloride SA (K-DUR,KLOR-CON) 20 MEQ tablet Take 20 mEq by mouth daily.  . ranitidine (ZANTAC) 300 MG tablet Take 300 mg by mouth at bedtime.   . simvastatin (ZOCOR) 10 MG tablet Take 10 mg by mouth daily.     REVIEW OF SYSTEMS  : All other systems reviewed and negative except where noted in the History of Present Illness.   PHYSICAL EXAM: BP 118/70  Pulse 68  Ht 5\' 5"  (1.651 m)  Wt 238 lb 6.4 oz (108.138 kg)  BMI 39.67 kg/m2  LMP 04/16/2014 General: Well developed black female in no acute distress Head: Normocephalic and atraumatic Eyes:  Sclerae anicteric, conjunctiva pink. Ears: Normal auditory acuity Lungs: Clear throughout to auscultation Heart: Regular rate and rhythm Abdomen: Soft, obese, non-distended.  Normal bowel sounds.  Non-tender. Rectal:  Deferred.  Will be done at the time of colonoscopy. Musculoskeletal: Symmetrical with no gross deformities  Skin: No lesions on visible extremities Extremities: No edema  Neurological: Alert oriented x 4, grossly non-focal Psychological:  Alert and cooperative. Normal mood and affect  ASSESSMENT AND PLAN: -IDA:  Has been anemia for several years (as long as she can remember).  Still having menses but not no menorrhagia.  Has sickle cell trait.  Intolerant to PO iron.  Has received 2 iron infusions since December.  Sees occasional bright red blood when straining to have a BM. -GERD:  Symptoms for about 10 years.  Currently only taking Zantac 300 mg  at bedtime.  *Will schedule EGD and colonoscopy to rule out GI source of IDA.  Will check stool for occult blood x 3.  If all is negative then likely no further evaluation from GI standpoint will be needed.  The risks, benefits, and alternatives were discussed with the patient and she consents to proceed.     Addendum: Reviewed and agree with initial management. Jerene Bears, MD

## 2014-05-15 NOTE — Addendum Note (Signed)
Addended by: Hope Pigeon A on: 05/15/2014 04:36 PM   Modules accepted: Orders

## 2014-05-15 NOTE — Patient Instructions (Signed)
You have been scheduled for an endoscopy and colonoscopy. Please follow the written instructions given to you at your visit today. Please pick up your prep at the pharmacy within the next 1-3 days. If you use inhalers (even only as needed), please bring them with you on the day of your procedure. Your physician has requested that you go to www.startemmi.com and enter the access code given to you at your visit today. This web site gives a general overview about your procedure. However, you should still follow specific instructions given to you by our office regarding your preparation for the procedure.  Hemoccult cards given to you today for you to do at home and mail back.

## 2014-05-21 ENCOUNTER — Ambulatory Visit (AMBULATORY_SURGERY_CENTER): Payer: BC Managed Care – PPO | Admitting: Internal Medicine

## 2014-05-21 ENCOUNTER — Encounter: Payer: Self-pay | Admitting: Internal Medicine

## 2014-05-21 VITALS — BP 128/61 | HR 81 | Temp 97.3°F | Resp 27 | Ht 65.0 in | Wt 238.0 lb

## 2014-05-21 DIAGNOSIS — D131 Benign neoplasm of stomach: Secondary | ICD-10-CM

## 2014-05-21 DIAGNOSIS — D129 Benign neoplasm of anus and anal canal: Secondary | ICD-10-CM

## 2014-05-21 DIAGNOSIS — D128 Benign neoplasm of rectum: Secondary | ICD-10-CM

## 2014-05-21 DIAGNOSIS — K2210 Ulcer of esophagus without bleeding: Secondary | ICD-10-CM

## 2014-05-21 DIAGNOSIS — K2990 Gastroduodenitis, unspecified, without bleeding: Secondary | ICD-10-CM

## 2014-05-21 DIAGNOSIS — D1330 Benign neoplasm of unspecified part of small intestine: Secondary | ICD-10-CM

## 2014-05-21 DIAGNOSIS — D126 Benign neoplasm of colon, unspecified: Secondary | ICD-10-CM

## 2014-05-21 DIAGNOSIS — D509 Iron deficiency anemia, unspecified: Secondary | ICD-10-CM

## 2014-05-21 DIAGNOSIS — K2970 Gastritis, unspecified, without bleeding: Secondary | ICD-10-CM

## 2014-05-21 LAB — GLUCOSE, CAPILLARY
Glucose-Capillary: 117 mg/dL — ABNORMAL HIGH (ref 70–99)
Glucose-Capillary: 105 mg/dL — ABNORMAL HIGH (ref 70–99)

## 2014-05-21 MED ORDER — PANTOPRAZOLE SODIUM 40 MG PO TBEC: 40.0000 mg | DELAYED_RELEASE_TABLET | Freq: Every day | ORAL | Status: DC

## 2014-05-21 MED ORDER — SODIUM CHLORIDE 0.9 % IV SOLN: 500.0000 mL | INTRAVENOUS | Status: DC

## 2014-05-21 NOTE — Op Note (Signed)
[place]  [address]. [place], [id number]   ENDOSCOPY PROCEDURE REPORT  PATIENT: Priscilla Solis, Priscilla Solis  MR#: [id number] BIRTHDATE: [birth year] , 43  yrs. old GENDER: Female ENDOSCOPIST: Jerene Bears, MD REFERRED BY:   Heath Lark, MD PROCEDURE DATE:  05/21/2014 PROCEDURE:  EGD w/ biopsy ASA CLASS:     Class II INDICATIONS:  Iron deficiency anemia. MEDICATIONS: MAC sedation, administered by CRNA and propofol (Diprivan) 250mg  IV TOPICAL ANESTHETIC: none  DESCRIPTION OF PROCEDURE: After the risks benefits and alternatives of the procedure were thoroughly explained, informed consent was obtained.  The LB [id number] endoscope was introduced through the mouth and advanced to the second portion of the duodenum. Without limitations.  The instrument was slowly withdrawn as the mucosa was fully examined.  ESOPHAGUS: An irregular Z-line was observed 40 cm from the incisors. Multiple biopsies were performed.   The esophagus was otherwise normal.  STOMACH: Moderate erosive gastritis (inflammation) was found in the gastric antrum.  Biopsies were taken in the antrum and angularis.   DUODENUM: The duodenal mucosa showed no abnormalities in the bulb and second portion of the duodenum.  Cold forceps biopsies were taken in the bulb and second portion to exclude celiac given IDA. Retroflexed views revealed no abnormalities.     The scope was then withdrawn from the patient and the procedure completed.  COMPLICATIONS: There were no complications.  ENDOSCOPIC IMPRESSION: 1.   Irregular Z-line was observed 40 cm from the incisors; multiple biopsies 2.   The esophagus was otherwise normal. 3.   Erosive gastritis (inflammation) was found in the gastric antrum; biopsies were taken in the antrum and angularis 4.   The duodenal mucosa showed no abnormalities in the bulb and second portion of the duodenum; biopsies  RECOMMENDATIONS: 1.  Await biopsy results 2.  Follow-up  of helicobacter pylori status, treat if indicated 3.  Begin pantoprazole 40 mg daily given gastritis  eSigned:  Jerene Bears, MD 05/21/2014 3:04 PM CC:The Patient; Heath Lark, MD; Venida Jarvis, MD

## 2014-05-21 NOTE — Op Note (Signed)
[place]  [address]. [place], [address]   COLONOSCOPY PROCEDURE REPORT  PATIENT: Priscilla Solis, Priscilla Solis  MR#: [id number] BIRTHDATE: Mar 03, 1971 , 43  yrs. old GENDER: Female ENDOSCOPIST: Jerene Bears, MD REFERRED BY: Heath Lark, MD PROCEDURE DATE:  05/21/2014 PROCEDURE:   Colonoscopy with cold biopsy polypectomy First Screening Colonoscopy - Avg.  risk and is 50 yrs.  old or older - No.  Prior Negative Screening - Now for repeat screening. N/A  History of Adenoma - Now for follow-up colonoscopy & has been > or = to 3 yrs.  N/A  Polyps Removed Today? Yes. ASA CLASS:   Class II INDICATIONS:Iron Deficiency Anemia. MEDICATIONS: MAC sedation, administered by CRNA and propofol (Diprivan) 200mg  IV  DESCRIPTION OF PROCEDURE:   After the risks benefits and alternatives of the procedure were thoroughly explained, informed consent was obtained.  A digital rectal exam revealed no rectal mass.   The LB [id number]  endoscope was introduced through the anus and advanced to the cecum, which was identified by both the appendix and ileocecal valve. No adverse events experienced. The quality of the prep was good, using MoviPrep  The instrument was then slowly withdrawn as the colon was fully examined.   COLON FINDINGS: A sessile polyp measuring 4 mm in size was found in the rectum.  A polypectomy was performed with cold forceps.  The resection was complete and the polyp tissue was completely retrieved.   The colon was otherwise normal.  There was no diverticulosis, inflammation, polyps or cancers unless previously stated.  Retroflexed views revealed purchase the anal papilla and no other abnormalities. The time to cecum=2 minutes 24 seconds. Withdrawal time=9 minutes 11 seconds.  The scope was withdrawn and the procedure completed. COMPLICATIONS: There were no complications.  ENDOSCOPIC IMPRESSION: 1.   Sessile polyp measuring 4 mm in size was found in the  rectum; polypectomy was performed with cold forceps 2.   The colon was otherwise normal  RECOMMENDATIONS: 1.  Await biopsy results 2.  If the polyp removed today is proven to be an adenomatous (pre-cancerous) polyp, you will need a repeat colonoscopy in 5 years.  Otherwise you should continue to follow colorectal cancer screening guidelines for "routine risk" patients with colonoscopy in 10 years.  You will receive a letter within 1-2 weeks with the results of your biopsy as well as final recommendations.  Please call my office if you have not received a letter after 3 weeks.   eSigned:  Jerene Bears, MD 05/21/2014 3:07 PM  cc: The Patient; Heath Lark, MD; Venida Jarvis, MD

## 2014-05-21 NOTE — Patient Instructions (Addendum)
YOU HAD AN ENDOSCOPIC PROCEDURE TODAY AT THE [facility]: Refer to the procedure report that was given to you for any specific questions about what was found during the examination.  If the procedure report does not answer your questions, please call your gastroenterologist to clarify.  If you requested that your care partner not be given the details of your procedure findings, then the procedure report has been included in a sealed envelope for you to review at your convenience later.  YOU SHOULD EXPECT: Some feelings of bloating in the abdomen. Passage of more gas than usual.  Walking can help get rid of the air that was put into your GI tract during the procedure and reduce the bloating. If you had a lower endoscopy (such as a colonoscopy or flexible sigmoidoscopy) you may notice spotting of blood in your stool or on the toilet paper. If you underwent a bowel prep for your procedure, then you may not have a normal bowel movement for a few days.  DIET: Your first meal following the procedure should be a light meal and then it is ok to progress to your normal diet.  A half-sandwich or bowl of soup is an example of a good first meal.  Heavy or fried foods are harder to digest and may make you feel nauseous or bloated.  Likewise meals heavy in dairy and vegetables can cause extra gas to form and this can also increase the bloating.  Drink plenty of fluids but you should avoid alcoholic beverages for 24 hours.  ACTIVITY: Your care partner should take you home directly after the procedure.  You should plan to take it easy, moving slowly for the rest of the day.  You can resume normal activity the day after the procedure however you should NOT DRIVE or use heavy machinery for 24 hours (because of the sedation medicines used during the test).    SYMPTOMS TO REPORT IMMEDIATELY: A gastroenterologist can be reached at any hour.  During normal business hours, 8:30 AM to 5:00 PM Monday through Friday,  call [phone].  After hours and on weekends, please call the GI answering service at [phone] who will take a message and have the physician on call contact you.   Following lower endoscopy (colonoscopy or flexible sigmoidoscopy):  Excessive amounts of blood in the stool  Significant tenderness or worsening of abdominal pains  Swelling of the abdomen that is new, acute  Fever of 100F or higher  Following upper endoscopy (EGD)  Vomiting of blood or coffee ground material  New chest pain or pain under the shoulder blades  Painful or persistently difficult swallowing  New shortness of breath  Fever of 100F or higher  Black, tarry-looking stools  FOLLOW UP: If any biopsies were taken you will be contacted by phone or by letter within the next 1-3 weeks.  Call your gastroenterologist if you have not heard about the biopsies in 3 weeks.  Our staff will call the home number listed on your records the next business day following your procedure to check on you and address any questions or concerns that you may have at that time regarding the information given to you following your procedure. This is a courtesy call and so if there is no answer at the home number and we have not heard from you through the emergency physician on call, we will assume that you have returned to your regular daily activities without incident.  SIGNATURES/CONFIDENTIALITY: You and/or your care   partner have signed paperwork which will be entered into your electronic medical record.  These signatures attest to the fact that that the information above on your After Visit Summary has been reviewed and is understood.  Full responsibility of the confidentiality of this discharge information lies with you and/or your care-partner.  INFORMATION ON GASTRITIS GIVEN TO YOU TODAY  BEGIN PANTOPRAZOLE 40 MG DAILY -RX SENT TO YOUR PHARMACY  INFORMATION ON POLYPS GIVEN TO YOU TODAY

## 2014-05-21 NOTE — Progress Notes (Signed)
Procedure ends, to recovery, report given and VSS. 

## 2014-05-21 NOTE — Progress Notes (Signed)
Called to room to assist during endoscopic procedure.  Patient ID and intended procedure confirmed with present staff. Received instructions for my participation in the procedure from the performing physician.  

## 2014-05-22 ENCOUNTER — Telehealth: Payer: Self-pay | Admitting: *Deleted

## 2014-05-22 NOTE — Telephone Encounter (Signed)
No answer, left message to call if question or concerns. 

## 2014-05-28 ENCOUNTER — Encounter: Payer: Self-pay | Admitting: Internal Medicine

## 2014-05-28 ENCOUNTER — Other Ambulatory Visit (HOSPITAL_BASED_OUTPATIENT_CLINIC_OR_DEPARTMENT_OTHER): Payer: BC Managed Care – PPO

## 2014-05-28 ENCOUNTER — Other Ambulatory Visit: Payer: Self-pay | Admitting: Hematology and Oncology

## 2014-05-28 ENCOUNTER — Telehealth: Payer: Self-pay | Admitting: Hematology and Oncology

## 2014-05-28 DIAGNOSIS — D509 Iron deficiency anemia, unspecified: Secondary | ICD-10-CM

## 2014-05-28 LAB — CBC & DIFF AND RETIC
BASO%: 0.1 % (ref 0.0–2.0)
BASOS ABS: 0 10*3/uL (ref 0.0–0.1)
EOS ABS: 0.2 10*3/uL (ref 0.0–0.5)
EOS%: 2 % (ref 0.0–7.0)
HCT: 37.5 % (ref 34.8–46.6)
HGB: 11.8 g/dL (ref 11.6–15.9)
Immature Retic Fract: 14.6 % — ABNORMAL HIGH (ref 1.60–10.00)
LYMPH%: 15.3 % (ref 14.0–49.7)
MCH: 23.6 pg — ABNORMAL LOW (ref 25.1–34.0)
MCHC: 31.5 g/dL (ref 31.5–36.0)
MCV: 74.9 fL — AB (ref 79.5–101.0)
MONO#: 0.5 10*3/uL (ref 0.1–0.9)
MONO%: 5.9 % (ref 0.0–14.0)
NEUT%: 76.7 % (ref 38.4–76.8)
NEUTROS ABS: 6.8 10*3/uL — AB (ref 1.5–6.5)
NRBC: 0 % (ref 0–0)
PLATELETS: 284 10*3/uL (ref 145–400)
RBC: 5.01 10*6/uL (ref 3.70–5.45)
RDW: 21.9 % — ABNORMAL HIGH (ref 11.2–14.5)
Retic %: 1.95 % (ref 0.70–2.10)
Retic Ct Abs: 97.7 10*3/uL — ABNORMAL HIGH (ref 33.70–90.70)
WBC: 8.9 10*3/uL (ref 3.9–10.3)
lymph#: 1.4 10*3/uL (ref 0.9–3.3)

## 2014-05-28 LAB — IRON AND TIBC CHCC
%SAT: 16 % — ABNORMAL LOW (ref 21–57)
Iron: 45 ug/dL (ref 41–142)
TIBC: 284 ug/dL (ref 236–444)
UIBC: 239 ug/dL (ref 120–384)

## 2014-05-28 LAB — FERRITIN CHCC: FERRITIN: 46 ng/mL (ref 9–269)

## 2014-05-28 NOTE — Telephone Encounter (Signed)
I reviewed blood test result with the patient.  Anemia has resolved. I suggest rescheduling her appointments to the future in 3 months with repeat blood work a week ahead of return visit and she agreed.

## 2014-05-29 ENCOUNTER — Telehealth: Payer: Self-pay | Admitting: Hematology and Oncology

## 2014-05-29 NOTE — Telephone Encounter (Signed)
lvm for pt regarding to [facility] and Dec appt....mailed pt appt sched/letter

## 2014-06-01 ENCOUNTER — Ambulatory Visit: Payer: BC Managed Care – PPO | Admitting: Hematology and Oncology

## 2014-06-05 DIAGNOSIS — K2970 Gastritis, unspecified, without bleeding: Secondary | ICD-10-CM | POA: Insufficient documentation

## 2014-07-09 ENCOUNTER — Encounter: Payer: Self-pay | Admitting: Internal Medicine

## 2014-07-09 ENCOUNTER — Ambulatory Visit (INDEPENDENT_AMBULATORY_CARE_PROVIDER_SITE_OTHER): Payer: BC Managed Care – PPO | Admitting: Internal Medicine

## 2014-07-09 VITALS — BP 104/70 | HR 60 | Ht 65.0 in | Wt 241.4 lb

## 2014-07-09 DIAGNOSIS — D509 Iron deficiency anemia, unspecified: Secondary | ICD-10-CM

## 2014-07-09 DIAGNOSIS — K2990 Gastroduodenitis, unspecified, without bleeding: Secondary | ICD-10-CM

## 2014-07-09 DIAGNOSIS — K2270 Barrett's esophagus without dysplasia: Secondary | ICD-10-CM

## 2014-07-09 DIAGNOSIS — K2970 Gastritis, unspecified, without bleeding: Secondary | ICD-10-CM

## 2014-07-09 MED ORDER — PANTOPRAZOLE SODIUM 40 MG PO TBEC: 40.0000 mg | DELAYED_RELEASE_TABLET | Freq: Every day | ORAL | Status: DC

## 2014-07-09 NOTE — Patient Instructions (Signed)
Follow up in 1 year Avoid NSAIDS We have sent your 90 days supply of protonix to your pharmacy

## 2014-07-09 NOTE — Progress Notes (Signed)
Patient ID: Priscilla Solis, female   DOB: 1971-03-03, 43 y.o.   MRN: [id number] HPI: Priscilla Solis is a 43 yo female with PMH of iron deficiency anemia worked up recently and found to have Barrett's esophagus, erosive gastritis, sickle cell trait, diabetes, hypertension, and GERD who is seen for followup. She is followed by hematology for her iron deficiency, Dr. Alvy Bimler.  She came for upper endoscopy and colonoscopy which were performed on 05/21/2014. Upper endoscopy revealed an irregular Z line, erosive gastritis which was biopsied, and a normal-appearing duodenum also biopsied. Colonoscopy revealed a 4 mm sessile rectal polyp but was otherwise normal. Duodenal biopsies were normal, gastric biopsies were also unremarkable without H. pylori. Z line biopsies did reveal Barrett's esophagus without dysplasia. The rectal polyp was hyperplastic. She's been on pantoprazole 40 mg since this procedure. At recent followup with hematology, her iron deficiency had improved and her anemia normalized. She states with pantoprazole she has been feeling better overall. No heartburn. No epigastric pain. No melena or rectal bleeding. Bowel movements have returned to normal for her without diarrhea or constipation. She is also using ranitidine 300 mg at bedtime. Previously she was using very frequent ibuprofen 800 for her menstrual cycle her headaches and generalized aches and pains. She has dramatically reduced her use of NSAIDs and has avoided ibuprofen altogether, however she is using Aleve on occasion. Family history notable for a son with Hirschsprung's  Past Medical History  Diagnosis Date  . Hypertension   . Diabetes mellitus without complication   . Sickle cell anemia     sickle cell trait  . Microcytic anemia 10/03/2013  . Hypokalemia 10/03/2013  . Allergy   . GERD (gastroesophageal reflux disease)     Past Surgical History  Procedure Laterality Date  . Tonsillectomy and adenoidectomy    . Nasal septum  surgery    . Palate / uvula biopsy / excision      Outpatient Prescriptions Prior to Visit  Medication Sig Dispense Refill  . budesonide-formoterol (SYMBICORT) 160-4.5 MCG/ACT inhaler Inhale into the lungs as needed. Frequency:BID   Dosage:0.0     Instructions:  Note:Dose: 160-4.5MCG      . cetirizine (ZYRTEC) 10 MG tablet Take 10 mg by mouth daily.      . hydrochlorothiazide (HYDRODIURIL) 25 MG tablet Take 25 mg by mouth as needed.       Marland Kitchen ibuprofen (ADVIL,MOTRIN) 800 MG tablet Take 800 mg by mouth every 8 (eight) hours as needed for pain.      Marland Kitchen losartan (COZAAR) 50 MG tablet Take 25 mg by mouth daily.      . metFORMIN (GLUCOPHAGE) 500 MG tablet Take 1,000 mg by mouth 2 (two) times daily with a meal.       . oxyCODONE-acetaminophen (PERCOCET) 10-325 MG per tablet       . potassium chloride SA (K-DUR,KLOR-CON) 20 MEQ tablet Take 20 mEq by mouth as needed.       . ranitidine (ZANTAC) 300 MG tablet Take 300 mg by mouth at bedtime.       . simvastatin (ZOCOR) 10 MG tablet Take 10 mg by mouth daily.      . pantoprazole (PROTONIX) 40 MG tablet Take 1 tablet (40 mg total) by mouth daily.  90 tablet  3   No facility-administered medications prior to visit.    No Known Allergies  Family History  Problem Relation Age of Onset  . Cancer Mother     Non invasive breast ca  . Breast  cancer Mother   . Cancer Cousin     ovarian cancer  . Diabetes Maternal Aunt   . Diabetes Maternal Uncle     History  Substance Use Topics  . Smoking status: Never Smoker   . Smokeless tobacco: Never Used  . Alcohol Use: No    ROS: As per history of present illness, otherwise negative  BP 104/70  Pulse 60  Ht 5\' 5"  (1.651 m)  Wt 241 lb 6.4 oz (109.498 kg)  BMI 40.17 kg/m2  LMP 06/14/2014 Constitutional: Well-developed and well-nourished. No distress. HEENT: Normocephalic and atraumatic. Oropharynx is clear and moist. No oropharyngeal exudate. Conjunctivae are normal.  No scleral  icterus. Cardiovascular: Normal rate, regular rhythm and intact distal pulses. No M/R/G Pulmonary/chest: Effort normal and breath sounds normal. No wheezing, rales or rhonchi. Abdominal: Soft, nontender, nondistended. Bowel sounds active throughout.  Extremities: no clubbing, cyanosis, or edema Neurological: Alert and oriented to person place and time.Marland Kitchen Psychiatric: Normal mood and affect. Behavior is normal.  RELEVANT LABS AND IMAGING: CBC    Component Value Date/Time   WBC 8.9 05/28/2014 0851   WBC 12.6* 06/06/2013 2134   RBC 5.01 05/28/2014 0851   RBC 4.65 06/06/2013 2134   HGB 11.8 05/28/2014 0851   HGB 8.1* 06/06/2013 2134   HCT 37.5 05/28/2014 0851   HCT 28.5* 06/06/2013 2134   PLT 284 05/28/2014 0851   PLT 508* 06/06/2013 2134   MCV 74.9* 05/28/2014 0851   MCV 61.3* 06/06/2013 2134   MCH 23.6* 05/28/2014 0851   MCH 17.4* 06/06/2013 2134   MCHC 31.5 05/28/2014 0851   MCHC 28.4* 06/06/2013 2134   RDW 21.9* 05/28/2014 0851   RDW 17.5* 06/06/2013 2134   LYMPHSABS 1.4 05/28/2014 0851   LYMPHSABS 1.9 06/06/2013 2134   MONOABS 0.5 05/28/2014 0851   MONOABS 0.8 06/06/2013 2134   EOSABS 0.2 05/28/2014 0851   EOSABS 0.1 06/06/2013 2134   BASOSABS 0.0 05/28/2014 0851   BASOSABS 0.0 06/06/2013 2134    CMP     Component Value Date/Time   NA 140 04/18/2014 1511   NA 139 06/06/2013 2134   K 2.8* 04/18/2014 1511   K 2.6* 06/06/2013 2134   CL 98 06/06/2013 2134   CO2 27 04/18/2014 1511   CO2 28 06/06/2013 2134   GLUCOSE 119 04/18/2014 1511   GLUCOSE 168* 06/06/2013 2134   BUN 13.1 04/18/2014 1511   BUN 9 06/06/2013 2134   CREATININE 0.9 04/18/2014 1511   CREATININE 0.77 06/06/2013 2134   CALCIUM 9.2 04/18/2014 1511   CALCIUM 9.4 06/06/2013 2134   PROT 7.4 06/06/2013 2134   ALBUMIN 3.8 06/06/2013 2134   AST 16 06/06/2013 2134   ALT 11 06/06/2013 2134   ALKPHOS 61 06/06/2013 2134   BILITOT 0.6 06/06/2013 2134   GFRNONAA >90 06/06/2013 2134   GFRAA >90 06/06/2013 2134   Iron/TIBC/Ferritin/ %Sat    Component Value Date/Time   IRON 45  05/28/2014 0851   TIBC 284 05/28/2014 0851   FERRITIN 46 05/28/2014 0851   IRONPCTSAT 16* 05/28/2014 0851    ASSESSMENT/PLAN:  43 yo female with PMH of iron deficiency anemia worked up recently and found to have Barrett's esophagus, erosive gastritis, sickle cell trait, diabetes, hypertension, and GERD who is seen for followup.   1. IDA/gastritis -- her iron deficiency is felt secondary to erosive gastritis related to NSAID use. She has dramatically reduced her use of NSAIDs and is taking daily PPI. Her anemia has improved and her are her studies  have also which is encouraging. She will continue to follow with hematology regarding her iron deficiency anemia. Iron replacement therapy will be handled by Dr. Alvy Bimler as felt needed.  I have recommended that she continue to avoid NSAIDs when possible and she will be remaining on daily PPI, see #2. PPI should help provide gastric protection for periodic use of NSAIDs  2. Barrett's esophagus -- her completely controlled on pantoprazole 40 mg daily. Short segment Barrett's without dysplasia. Indefinite PPI was recommended along with surveillance endoscopy in 3 years. She is aware of this recommendation.  3. CRC screening -- colon polyp is hyperplastic, repeat screening recommended in 10 years  Return to clinic in one year, sooner if necessary.

## 2014-08-15 ENCOUNTER — Other Ambulatory Visit: Payer: Self-pay | Admitting: Hematology and Oncology

## 2014-08-28 ENCOUNTER — Other Ambulatory Visit (HOSPITAL_BASED_OUTPATIENT_CLINIC_OR_DEPARTMENT_OTHER): Payer: BC Managed Care – PPO

## 2014-08-28 DIAGNOSIS — D509 Iron deficiency anemia, unspecified: Secondary | ICD-10-CM

## 2014-08-28 LAB — CBC & DIFF AND RETIC
BASO%: 0.2 % (ref 0.0–2.0)
BASOS ABS: 0 10*3/uL (ref 0.0–0.1)
EOS ABS: 0.2 10*3/uL (ref 0.0–0.5)
EOS%: 2.4 % (ref 0.0–7.0)
HEMATOCRIT: 34.5 % — AB (ref 34.8–46.6)
HEMOGLOBIN: 10.8 g/dL — AB (ref 11.6–15.9)
IMMATURE RETIC FRACT: 13.6 % — AB (ref 1.60–10.00)
LYMPH#: 1.8 10*3/uL (ref 0.9–3.3)
LYMPH%: 20.2 % (ref 14.0–49.7)
MCH: 23.2 pg — AB (ref 25.1–34.0)
MCHC: 31.3 g/dL — ABNORMAL LOW (ref 31.5–36.0)
MCV: 74 fL — AB (ref 79.5–101.0)
MONO#: 0.7 10*3/uL (ref 0.1–0.9)
MONO%: 7.2 % (ref 0.0–14.0)
NEUT#: 6.3 10*3/uL (ref 1.5–6.5)
NEUT%: 70 % (ref 38.4–76.8)
Platelets: 325 10*3/uL (ref 145–400)
RBC: 4.66 10*6/uL (ref 3.70–5.45)
RDW: 16 % — AB (ref 11.2–14.5)
RETIC CT ABS: 66.64 10*3/uL (ref 33.70–90.70)
Retic %: 1.43 % (ref 0.70–2.10)
WBC: 9 10*3/uL (ref 3.9–10.3)

## 2014-08-28 LAB — IRON AND TIBC CHCC
%SAT: 9 % — ABNORMAL LOW (ref 21–57)
IRON: 36 ug/dL — AB (ref 41–142)
TIBC: 394 ug/dL (ref 236–444)
UIBC: 358 ug/dL (ref 120–384)

## 2014-08-28 LAB — FERRITIN CHCC: Ferritin: 5 ng/ml — ABNORMAL LOW (ref 9–269)

## 2014-08-29 ENCOUNTER — Telehealth: Payer: Self-pay | Admitting: *Deleted

## 2014-08-29 NOTE — Telephone Encounter (Signed)
Informed pt of IV iron scheduled after she sees Dr. Alvy Bimler next week.  Pt verbalized understanding.

## 2014-08-29 NOTE — Telephone Encounter (Signed)
Per staff message and POF I have scheduled appts. Advised scheduler of appts. JMW  

## 2014-08-29 NOTE — Telephone Encounter (Signed)
-----   Message from Heath Lark, MD sent at 08/28/2014  7:57 PM EST ----- Regarding: IV iron Can we schedule IV iron on the same day she returns to see me next week? Thanks [place].  [place], please inform here she will need repeat IV iron Rx

## 2014-08-31 LAB — HEMOGLOBINOPATHY EVALUATION
HEMOGLOBIN OTHER: 0 %
Hgb A2 Quant: 2.6 % (ref 2.2–3.2)
Hgb A: 63.8 % — ABNORMAL LOW (ref 96.8–97.8)
Hgb F Quant: 0 % (ref 0.0–2.0)
Hgb S Quant: 33.6 % — ABNORMAL HIGH

## 2014-09-07 ENCOUNTER — Ambulatory Visit (HOSPITAL_BASED_OUTPATIENT_CLINIC_OR_DEPARTMENT_OTHER): Payer: BC Managed Care – PPO | Admitting: Hematology and Oncology

## 2014-09-07 ENCOUNTER — Telehealth: Payer: Self-pay | Admitting: Hematology and Oncology

## 2014-09-07 ENCOUNTER — Encounter: Payer: Self-pay | Admitting: Hematology and Oncology

## 2014-09-07 ENCOUNTER — Ambulatory Visit: Payer: BC Managed Care – PPO

## 2014-09-07 DIAGNOSIS — K2270 Barrett's esophagus without dysplasia: Secondary | ICD-10-CM

## 2014-09-07 DIAGNOSIS — D509 Iron deficiency anemia, unspecified: Secondary | ICD-10-CM

## 2014-09-07 DIAGNOSIS — N920 Excessive and frequent menstruation with regular cycle: Secondary | ICD-10-CM | POA: Insufficient documentation

## 2014-09-07 NOTE — Assessment & Plan Note (Signed)
This is due to GERD. She is on PPI. She wants to continue taking ibuprofen as needed

## 2014-09-07 NOTE — Assessment & Plan Note (Signed)
We discussed options. She declined most options that were presented to her by her gynaecologist. She will continue iron infusion support for now

## 2014-09-07 NOTE — Telephone Encounter (Signed)
gv and printed appt sched and avs for pt for Dec and June 2016....sed added tx

## 2014-09-07 NOTE — Assessment & Plan Note (Signed)
She has recurrent iron deficiency anemia, likely due to menorrhagia and mild GI bleed from Barrett's esophagus She declined intravenous iron infusion today because she feels fine She wants to reschedule blood draw and IV iron in 2 weeks. I plan to see her again in 6 months

## 2014-09-07 NOTE — Progress Notes (Signed)
[place], MD SUMMARY OF HEMATOLOGIC HISTORY: This is a pleasant lady who is being referred here because of severe iron deficiency anemia and intolerance to oral iron supplement. The cause of her iron deficiency anemia is likely due to excessive menstruation from uterine fibroids. She received one dose of intravenous iron on 10/06/2013 and on 04/25/14. EGD in August 2015 showed duodenitis, esophagitis and Barrett's esophagus INTERVAL HISTORY: Priscilla Solis 43 y.o. female returns for further follow-up. She denies symptoms of anemia. The patient denies any recent signs or symptoms of bleeding such as spontaneous epistaxis, hematuria or hematochezia. She has menorrhagia. She takes regular NSAIDS for mild pain.  I have reviewed the past medical history, past surgical history, social history and family history with the patient and they are unchanged from previous note.  ALLERGIES:  has No Known Allergies.  MEDICATIONS:  Current Outpatient Prescriptions  Medication Sig Dispense Refill  . budesonide-formoterol (SYMBICORT) 160-4.5 MCG/ACT inhaler Inhale into the lungs as needed. Frequency:BID   Dosage:0.0     Instructions:  Note:Dose: 160-4.5MCG    . cetirizine (ZYRTEC) 10 MG tablet Take 10 mg by mouth daily.    . hydrochlorothiazide (HYDRODIURIL) 25 MG tablet Take 25 mg by mouth as needed.     Marland Kitchen ibuprofen (ADVIL,MOTRIN) 800 MG tablet Take 800 mg by mouth every 8 (eight) hours as needed for pain.    Marland Kitchen losartan (COZAAR) 50 MG tablet Take 25 mg by mouth daily.    . metFORMIN (GLUCOPHAGE) 500 MG tablet Take 1,000 mg by mouth 2 (two) times daily with a meal.     . oxyCODONE-acetaminophen (PERCOCET) 10-325 MG per tablet     . pantoprazole (PROTONIX) 40 MG tablet Take 1 tablet (40 mg total) by mouth daily. 90 tablet 3  . potassium chloride SA (K-DUR,KLOR-CON) 20 MEQ tablet Take 20 mEq by mouth as needed.     . ranitidine (ZANTAC) 300 MG tablet  Take 300 mg by mouth at bedtime.     . simvastatin (ZOCOR) 10 MG tablet Take 10 mg by mouth daily.     No current facility-administered medications for this visit.     REVIEW OF SYSTEMS:   Constitutional: Denies fevers, chills or night sweats Eyes: Denies blurriness of vision Ears, nose, mouth, throat, and face: Denies mucositis or sore throat Respiratory: Denies cough, dyspnea or wheezes Cardiovascular: Denies palpitation, chest discomfort or lower extremity swelling Gastrointestinal:  Denies nausea, heartburn or change in bowel habits Skin: Denies abnormal skin rashes Lymphatics: Denies new lymphadenopathy or easy bruising Neurological:Denies numbness, tingling or new weaknesses Behavioral/Psych: Mood is stable, no new changes  All other systems were reviewed with the patient and are negative.  PHYSICAL EXAMINATION: ECOG PERFORMANCE STATUS: 0 - Asymptomatic GENERAL:alert, no distress and comfortable SKIN: skin color, texture, turgor are normal, no rashes or significant lesions EYES: normal, Conjunctiva are pink and non-injected, sclera clear OROPHARYNX:no exudate, no erythema and lips, buccal mucosa, and tongue normal  NECK: supple, thyroid normal size, non-tender, without nodularity LYMPH:  no palpable lymphadenopathy in the cervical, axillary or inguinal LUNGS: clear to auscultation and percussion with normal breathing effort HEART: regular rate & rhythm and no murmurs and no lower extremity edema ABDOMEN:abdomen soft, non-tender and normal bowel sounds Musculoskeletal:no cyanosis of digits and no clubbing  NEURO: alert & oriented x 3 with fluent speech, no focal motor/sensory deficits  LABORATORY DATA:  I have reviewed the data as listed No results found for this or any previous  visit (from the past 48 hour(s)).  Lab Results  Component Value Date   WBC 9.0 08/28/2014   HGB 10.8* 08/28/2014   HCT 34.5* 08/28/2014   MCV 74.0* 08/28/2014   PLT 325 08/28/2014   ASSESSMENT  & PLAN:  Anemia, iron deficiency She has recurrent iron deficiency anemia, likely due to menorrhagia and mild GI bleed from Barrett's esophagus She declined intravenous iron infusion today because she feels fine She wants to reschedule blood draw and IV iron in 2 weeks. I plan to see her again in 6 months   Barrett's esophagus This is due to GERD. She is on PPI. She wants to continue taking ibuprofen as needed  Menorrhagia We discussed options. She declined most options that were presented to her by her gynaecologist. She will continue iron infusion support for now   All questions were answered. The patient knows to call the clinic with any problems, questions or concerns. No barriers to learning was detected.  I spent 15 minutes counseling the patient face to face. The total time spent in the appointment was 20 minutes and more than 50% was on counseling.     [place], [place], MD 09/07/2014 10:33 AM

## 2014-09-21 ENCOUNTER — Other Ambulatory Visit (HOSPITAL_BASED_OUTPATIENT_CLINIC_OR_DEPARTMENT_OTHER): Payer: BC Managed Care – PPO

## 2014-09-21 ENCOUNTER — Ambulatory Visit (HOSPITAL_BASED_OUTPATIENT_CLINIC_OR_DEPARTMENT_OTHER): Payer: BC Managed Care – PPO

## 2014-09-21 ENCOUNTER — Telehealth: Payer: Self-pay | Admitting: *Deleted

## 2014-09-21 DIAGNOSIS — D509 Iron deficiency anemia, unspecified: Secondary | ICD-10-CM

## 2014-09-21 LAB — IRON AND TIBC CHCC
%SAT: 8 % — ABNORMAL LOW (ref 21–57)
IRON: 30 ug/dL — AB (ref 41–142)
TIBC: 370 ug/dL (ref 236–444)
UIBC: 340 ug/dL (ref 120–384)

## 2014-09-21 LAB — CBC & DIFF AND RETIC
BASO%: 0.2 % (ref 0.0–2.0)
BASOS ABS: 0 10*3/uL (ref 0.0–0.1)
EOS ABS: 0.3 10*3/uL (ref 0.0–0.5)
EOS%: 2.5 % (ref 0.0–7.0)
HEMATOCRIT: 34.3 % — AB (ref 34.8–46.6)
HEMOGLOBIN: 10.4 g/dL — AB (ref 11.6–15.9)
IMMATURE RETIC FRACT: 17.2 % — AB (ref 1.60–10.00)
LYMPH#: 1.6 10*3/uL (ref 0.9–3.3)
LYMPH%: 15.9 % (ref 14.0–49.7)
MCH: 22.3 pg — ABNORMAL LOW (ref 25.1–34.0)
MCHC: 30.3 g/dL — ABNORMAL LOW (ref 31.5–36.0)
MCV: 73.4 fL — ABNORMAL LOW (ref 79.5–101.0)
MONO#: 0.8 10*3/uL (ref 0.1–0.9)
MONO%: 7.9 % (ref 0.0–14.0)
NEUT#: 7.3 10*3/uL — ABNORMAL HIGH (ref 1.5–6.5)
NEUT%: 73.5 % (ref 38.4–76.8)
PLATELETS: 377 10*3/uL (ref 145–400)
RBC: 4.67 10*6/uL (ref 3.70–5.45)
RDW: 15.8 % — ABNORMAL HIGH (ref 11.2–14.5)
Retic %: 1.53 % (ref 0.70–2.10)
Retic Ct Abs: 71.45 10*3/uL (ref 33.70–90.70)
WBC: 10 10*3/uL (ref 3.9–10.3)

## 2014-09-21 LAB — FERRITIN CHCC: FERRITIN: 11 ng/mL (ref 9–269)

## 2014-09-21 MED ORDER — SODIUM CHLORIDE 0.9 % IV SOLN
510.0000 mg | Freq: Once | INTRAVENOUS | Status: AC
  Administered 2014-09-21: 510 mg via INTRAVENOUS
  Filled 2014-09-21: qty 17

## 2014-09-21 MED ORDER — SODIUM CHLORIDE 0.9 % IV SOLN
Freq: Once | INTRAVENOUS | Status: AC
  Administered 2014-09-21: 09:00:00 via INTRAVENOUS

## 2014-09-21 MED ORDER — HEPARIN SOD (PORK) LOCK FLUSH 100 UNIT/ML IV SOLN
500.0000 [IU] | Freq: Once | INTRAVENOUS | Status: DC | PRN
  Filled 2014-09-21: qty 5

## 2014-09-21 NOTE — Telephone Encounter (Signed)
Per staff message I have scheduled appts. JMW  

## 2014-09-27 ENCOUNTER — Other Ambulatory Visit: Payer: Self-pay | Admitting: Oncology

## 2014-09-27 ENCOUNTER — Ambulatory Visit (HOSPITAL_BASED_OUTPATIENT_CLINIC_OR_DEPARTMENT_OTHER): Payer: BC Managed Care – PPO

## 2014-09-27 DIAGNOSIS — D509 Iron deficiency anemia, unspecified: Secondary | ICD-10-CM

## 2014-09-27 MED ORDER — SODIUM CHLORIDE 0.9 % IV SOLN
510.0000 mg | Freq: Once | INTRAVENOUS | Status: AC
  Administered 2014-09-27: 510 mg via INTRAVENOUS
  Filled 2014-09-27: qty 17

## 2014-09-27 MED ORDER — SODIUM CHLORIDE 0.9 % IV SOLN
Freq: Once | INTRAVENOUS | Status: AC
  Administered 2014-09-27: 12:00:00 via INTRAVENOUS

## 2014-09-27 NOTE — Patient Instructions (Signed)

## 2014-11-23 ENCOUNTER — Ambulatory Visit (INDEPENDENT_AMBULATORY_CARE_PROVIDER_SITE_OTHER): Payer: BLUE CROSS/BLUE SHIELD | Admitting: Obstetrics and Gynecology

## 2014-11-23 ENCOUNTER — Encounter: Payer: Self-pay | Admitting: Obstetrics and Gynecology

## 2014-11-23 VITALS — BP 120/68 | HR 76 | Resp 18 | Ht 66.0 in | Wt 239.8 lb

## 2014-11-23 DIAGNOSIS — N920 Excessive and frequent menstruation with regular cycle: Secondary | ICD-10-CM

## 2014-11-23 DIAGNOSIS — D259 Leiomyoma of uterus, unspecified: Secondary | ICD-10-CM

## 2014-11-23 LAB — CBC
HEMATOCRIT: 40 % (ref 36.0–46.0)
Hemoglobin: 12.8 g/dL (ref 12.0–15.0)
MCH: 24.3 pg — ABNORMAL LOW (ref 26.0–34.0)
MCHC: 32 g/dL (ref 30.0–36.0)
MCV: 76 fL — ABNORMAL LOW (ref 78.0–100.0)
MPV: 9.2 fL (ref 8.6–12.4)
Platelets: 321 10*3/uL (ref 150–400)
RBC: 5.26 MIL/uL — ABNORMAL HIGH (ref 3.87–5.11)
RDW: 20.6 % — ABNORMAL HIGH (ref 11.5–15.5)
WBC: 11.3 10*3/uL — AB (ref 4.0–10.5)
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
## 2014-11-24 LAB — TSH: TSH: 0.907 u[IU]/mL (ref 0.350–4.500)

## 2014-11-24 LAB — FERRITIN: FERRITIN: 28 ng/mL (ref 10–291)

## 2014-11-26 ENCOUNTER — Telehealth: Payer: Self-pay | Admitting: *Deleted

## 2014-11-26 NOTE — Telephone Encounter (Signed)
Left message of cell number. No answer and no voice mail on home number. Patient spoke to [place] today regarding scheduling [facility] and had questions.   Calling to advise patient records requests has been sent to [facility]  to obtain previous PUS/SHGM results. This was sent Friday 11-23-14. Per Dr Quincy Simmonds, can wait until records received and reviewed before scheduling additional testing.

## 2014-12-04 NOTE — Telephone Encounter (Signed)
Records received today and sent to your office.

## 2014-12-05 NOTE — Telephone Encounter (Signed)
Patient returning call and will try back at 3:45. She is in a class.

## 2014-12-05 NOTE — Telephone Encounter (Signed)
Spoke with patient. Advised patient of message as seen below from [place]. Patient is agreeable and would like to proceed with scheduling at this time. Patient would like appointment towards the end of March due to cost. Appointment scheduled for 3/31 at 10:30am for [facility] and EMB with Dr.Silva. Patient verbalized understanding of the [facility] appointment cancellation policy. Advised will need to cancel or reschedule within 72 business hours of appointment (3 business days) or will have $100.00 late cancellation fee placed to account. Advised to take 800 mg of ibuprofen/motrin 1 hour before appointment. Patient is agreeable.  Routing to provider for final review. Patient agreeable to disposition. Will close encounter

## 2014-12-05 NOTE — Telephone Encounter (Signed)
I have just reviewed the patient's records from her prior provider.  She does have fibroids.  No sonohysterogram was done.  She did not have an endometrial biopsy.   I recommend proceeding with the sonohysterogram and the endometrial biopsy.  I need to understand the size of the fibroids now and the uterine cavity as we plan for potential hysterectomy. It helps me to understand how to do the hysterectomy - i.e. Robotic with placement of the uterine manipulator.  Thanks!

## 2014-12-05 NOTE — Telephone Encounter (Signed)
Call to patient, left message to call back. 

## 2014-12-27 ENCOUNTER — Telehealth: Payer: Self-pay | Admitting: Obstetrics and Gynecology

## 2014-12-27 NOTE — Telephone Encounter (Signed)
Thank you for the update.  No DNKA fee. 

## 2014-12-27 NOTE — Telephone Encounter (Signed)
Pt called to cancel her ultrasound for 23-Jan-2015 due to a sudden death in the family. Pt asks that we call on 01/04/15 or after to reschedule. She is not sure how long she will be out of town.

## 2014-12-27 NOTE — Telephone Encounter (Signed)
Appointment has been cancelled.  Routing to [place] to hold in work que.  Routing to Dr. Quincy Simmonds as Juluis Rainier.

## 2015-01-03 ENCOUNTER — Other Ambulatory Visit: Payer: BLUE CROSS/BLUE SHIELD

## 2015-01-03 ENCOUNTER — Other Ambulatory Visit: Payer: BLUE CROSS/BLUE SHIELD | Admitting: Obstetrics and Gynecology

## 2015-01-08 NOTE — Telephone Encounter (Signed)
Call to patient to see if she is ready to rescheduled SHGM/EMB. Patient states that she is not going to have it done. I advised patient that I would message provider with this information.

## 2015-01-08 NOTE — Telephone Encounter (Signed)
I would recommend an August 2016 visit with me because I think she may be due for her annual exam at that time? Mammogram is due in July, so I made this assumption.  I am happy to help if she needs anything.

## 2015-01-08 NOTE — Telephone Encounter (Signed)
Call to patient. Advised I am calling to follow-up with her regarding her request for surgery. She states she has changed her mind and has decided personally that she is mentally not ready for hysterectomy. States last cycle was much easier and symptoms are currently manageable. She wants to monitor next cycle and see how labs are at next hematology appointment.  Will then make decision on what other treatment options she might be interested in. She states she will call back when she is ready to proceed with treatment but right now, does not want to proceed.  Advised I will update Dr Quincy Simmonds and check on recommendation for follow-up. May recommend recheck in 6 months even if no treatment desired but will check with MD.

## 2015-01-08 NOTE — Telephone Encounter (Signed)
Please let me know how I can help the patient.   She was considering hysterectomy when she established care in February 2016.   [place]

## 2015-01-10 NOTE — Telephone Encounter (Signed)
Call to patient. Advised of Dr Elza Rafter recommendation for AEX/6 month follow-up in August. Scheduled appointment for 05-30-15 at [facility] due 04-2015. Patient agreeable to plan.

## 2015-03-13 ENCOUNTER — Telehealth: Payer: Self-pay | Admitting: Hematology and Oncology

## 2015-03-13 NOTE — Telephone Encounter (Signed)
pt called to r/s appt..done...pt aware of new d.t °

## 2015-03-15 ENCOUNTER — Ambulatory Visit: Payer: BC Managed Care – PPO | Admitting: Hematology and Oncology

## 2015-03-15 ENCOUNTER — Other Ambulatory Visit: Payer: BC Managed Care – PPO

## 2015-04-11 ENCOUNTER — Telehealth: Payer: Self-pay | Admitting: *Deleted

## 2015-04-11 ENCOUNTER — Encounter: Payer: Self-pay | Admitting: Obstetrics and Gynecology

## 2015-04-11 NOTE — Telephone Encounter (Signed)
See previous phone message from April. Endometrial biopsy was ordered as part of work-up for surgery. Patient has declined to proceed with surgery. Has follow-up scheduled on August 25,2016 with Dr Quincy Simmonds.  Ok to discontinue order for endometrial biopsy?

## 2015-04-11 NOTE — Telephone Encounter (Signed)
I would like to have the patient's prior EMB from [facility].  I do not see it scanned in to her record, so I am not sure if I ever received it.  I will ask her for this when she comes in for her visit in August 2016.  OK to discontinue the order for her EMB here.

## 2015-04-16 ENCOUNTER — Ambulatory Visit: Payer: Self-pay | Admitting: Hematology and Oncology

## 2015-04-16 ENCOUNTER — Encounter: Payer: Self-pay | Admitting: *Deleted

## 2015-04-16 ENCOUNTER — Encounter: Payer: Self-pay | Admitting: Hematology and Oncology

## 2015-04-16 ENCOUNTER — Other Ambulatory Visit: Payer: Self-pay

## 2015-04-16 NOTE — Telephone Encounter (Signed)
SHGM and endometrial biopsy orders competed. Encounter closed.

## 2015-04-16 NOTE — Progress Notes (Signed)
Letter from Dr. Alvy Bimler regarding no show for appt today placed in outgoing mail to pt's home address listed in her chart.

## 2015-04-23 ENCOUNTER — Telehealth: Payer: Self-pay | Admitting: Hematology and Oncology

## 2015-04-23 NOTE — Telephone Encounter (Signed)
Patient returned my call today re rescheduling her appointment and was given new appointment for lab/NG 05/14/15 @ 9 am - date/time per patient based on her schedule. Patient informed that we did receive her message and the letter she received from our practice (NG) was not a discharge letter but a letter informing her of the now show and that should this continue she could be discharged as the provider (NG) is requesting a more advanced notice (24 hrs per Cameo) than calling in just before or a few hours before her appointment.   Patient called last week upset re a letter she received discharging her from the practice. Patient stated she did call and left a message for the scheduler 2 hrs prior to her appointment informing us that she would not be coming and would call to reschedule. Patient appointment was scheduled for 04/16/15 lab/NG @ 10:30 am - patient's call was recorded as coming in 04/16/15 @ 9:01 am.  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 After speaking with the desk nurse last week and pulling the letter - the letter was a warning of possible discharge as patient has on other occassions called just before the appointments to cancel or no showed.   Patient was informed that this was a letter warning of possible discharge due to short notice cancellations or no shows and asked that she give the office a 24 hr notice when cancelling.

## 2015-05-13 ENCOUNTER — Other Ambulatory Visit: Payer: Self-pay | Admitting: Hematology and Oncology

## 2015-05-13 DIAGNOSIS — D509 Iron deficiency anemia, unspecified: Secondary | ICD-10-CM

## 2015-05-14 ENCOUNTER — Ambulatory Visit (HOSPITAL_BASED_OUTPATIENT_CLINIC_OR_DEPARTMENT_OTHER): Payer: Commercial Managed Care - PPO | Admitting: Hematology and Oncology

## 2015-05-14 ENCOUNTER — Encounter: Payer: Self-pay | Admitting: Hematology and Oncology

## 2015-05-14 ENCOUNTER — Telehealth: Payer: Self-pay | Admitting: Hematology and Oncology

## 2015-05-14 ENCOUNTER — Other Ambulatory Visit (HOSPITAL_BASED_OUTPATIENT_CLINIC_OR_DEPARTMENT_OTHER): Payer: Commercial Managed Care - PPO

## 2015-05-14 VITALS — BP 115/58 | HR 90 | Temp 98.4°F | Resp 19 | Ht 66.0 in | Wt 247.3 lb

## 2015-05-14 DIAGNOSIS — K2270 Barrett's esophagus without dysplasia: Secondary | ICD-10-CM | POA: Diagnosis not present

## 2015-05-14 DIAGNOSIS — N921 Excessive and frequent menstruation with irregular cycle: Secondary | ICD-10-CM

## 2015-05-14 DIAGNOSIS — D509 Iron deficiency anemia, unspecified: Secondary | ICD-10-CM | POA: Diagnosis not present

## 2015-05-14 LAB — CBC & DIFF AND RETIC
BASO%: 0.1 % (ref 0.0–2.0)
BASOS ABS: 0 10*3/uL (ref 0.0–0.1)
EOS%: 2.6 % (ref 0.0–7.0)
Eosinophils Absolute: 0.3 10*3/uL (ref 0.0–0.5)
HCT: 31.9 % — ABNORMAL LOW (ref 34.8–46.6)
HGB: 9.7 g/dL — ABNORMAL LOW (ref 11.6–15.9)
Immature Retic Fract: 16.6 % — ABNORMAL HIGH (ref 1.60–10.00)
LYMPH%: 14.8 % (ref 14.0–49.7)
MCH: 20.8 pg — AB (ref 25.1–34.0)
MCHC: 30.4 g/dL — ABNORMAL LOW (ref 31.5–36.0)
MCV: 68.5 fL — ABNORMAL LOW (ref 79.5–101.0)
MONO#: 0.8 10*3/uL (ref 0.1–0.9)
MONO%: 7.6 % (ref 0.0–14.0)
NEUT%: 74.9 % (ref 38.4–76.8)
NEUTROS ABS: 8.2 10*3/uL — AB (ref 1.5–6.5)
Platelets: 392 10*3/uL (ref 145–400)
RBC: 4.66 10*6/uL (ref 3.70–5.45)
RDW: 16.6 % — AB (ref 11.2–14.5)
RETIC %: 1.48 % (ref 0.70–2.10)
Retic Ct Abs: 68.97 10*3/uL (ref 33.70–90.70)
WBC: 11 10*3/uL — AB (ref 3.9–10.3)
lymph#: 1.6 10*3/uL (ref 0.9–3.3)

## 2015-05-14 LAB — IRON AND TIBC CHCC
%SAT: 5 % — AB (ref 21–57)
IRON: 22 ug/dL — AB (ref 41–142)
TIBC: 403 ug/dL (ref 236–444)
UIBC: 381 ug/dL (ref 120–384)

## 2015-05-14 LAB — FERRITIN CHCC: Ferritin: 4 ng/ml — ABNORMAL LOW (ref 9–269)

## 2015-05-14 NOTE — Progress Notes (Signed)
[place], MD SUMMARY OF HEMATOLOGIC HISTORY:  This is a pleasant lady who is being referred here because of severe iron deficiency anemia and intolerance to oral iron supplement. The cause of her iron deficiency anemia is likely due to excessive menstruation from uterine fibroids. She received one dose of intravenous iron on 10/06/2013 and on 04/25/14. EGD in August 2015 showed duodenitis, esophagitis and Barrett's esophagus INTERVAL HISTORY: Priscilla Solis 44 y.o. female returns for further follow-up. She complained of fatigue. She continues to have heavy menstruation but decline surgical option for uterine fibroids. She decline taking oral iron supplement because it make her unwell in the past. The patient denies any recent signs or symptoms of bleeding such as spontaneous epistaxis, hematuria or hematochezia.   I have reviewed the past medical history, past surgical history, social history and family history with the patient and they are unchanged from previous note.  ALLERGIES:  has No Known Allergies.  MEDICATIONS:  Current Outpatient Prescriptions  Medication Sig Dispense Refill  . budesonide-formoterol (SYMBICORT) 160-4.5 MCG/ACT inhaler Inhale into the lungs as needed. Frequency:BID   Dosage:0.0     Instructions:  Note:Dose: 160-4.5MCG    . cetirizine (ZYRTEC) 10 MG tablet Take 10 mg by mouth daily.    . hydrochlorothiazide (HYDRODIURIL) 25 MG tablet Take 25 mg by mouth as needed (during menstrual cycle).     Marland Kitchen ibuprofen (ADVIL,MOTRIN) 800 MG tablet Take 800 mg by mouth as needed.    Marland Kitchen losartan (COZAAR) 50 MG tablet Take 25 mg by mouth daily.    . metFORMIN (GLUCOPHAGE) 1000 MG tablet Take 1,000 mg by mouth 2 (two) times daily.    Marland Kitchen oxyCODONE-acetaminophen (PERCOCET) 10-325 MG per tablet     . pantoprazole (PROTONIX) 40 MG tablet Take 1 tablet (40 mg total) by mouth daily. 90 tablet 3  . potassium chloride SA  (K-DUR,KLOR-CON) 20 MEQ tablet Take 20 mEq by mouth as needed.     . ranitidine (ZANTAC) 300 MG tablet Take 300 mg by mouth at bedtime.     . simvastatin (ZOCOR) 10 MG tablet Take 10 mg by mouth daily.     No current facility-administered medications for this visit.     REVIEW OF SYSTEMS:   Constitutional: Denies fevers, chills or night sweats Eyes: Denies blurriness of vision Ears, nose, mouth, throat, and face: Denies mucositis or sore throat Respiratory: Denies cough, dyspnea or wheezes Cardiovascular: Denies palpitation, chest discomfort or lower extremity swelling Gastrointestinal:  Denies nausea, heartburn or change in bowel habits Skin: Denies abnormal skin rashes Lymphatics: Denies new lymphadenopathy or easy bruising Neurological:Denies numbness, tingling or new weaknesses Behavioral/Psych: Mood is stable, no new changes  All other systems were reviewed with the patient and are negative.  PHYSICAL EXAMINATION: ECOG PERFORMANCE STATUS: 0 - Asymptomatic  Filed Vitals:   05/14/15 0903  BP: 115/58  Pulse: 90  Temp: 98.4 F (36.9 C)  Resp: 19   Filed Weights   05/14/15 0903  Weight: 247 lb 4.8 oz (112.175 kg)    GENERAL:alert, no distress and comfortable SKIN: skin color, texture, turgor are normal, no rashes or significant lesions EYES: normal, Conjunctiva are pink and non-injected, sclera clear Musculoskeletal:no cyanosis of digits and no clubbing  NEURO: alert & oriented x 3 with fluent speech, no focal motor/sensory deficits  LABORATORY DATA:  I have reviewed the data as listed Results for orders placed or performed in visit on 05/14/15 (from the past 48 hour(s))  CBC &  Diff and Retic     Status: Abnormal   Collection Time: 05/14/15  8:52 AM  Result Value Ref Range   WBC 11.0 (H) 3.9 - 10.3 10e3/uL   NEUT# 8.2 (H) 1.5 - 6.5 10e3/uL   HGB 9.7 (L) 11.6 - 15.9 g/dL   HCT 31.9 (L) 34.8 - 46.6 %   Platelets 392 145 - 400 10e3/uL   MCV 68.5 (L) 79.5 - 101.0 fL    MCH 20.8 (L) 25.1 - 34.0 pg   MCHC 30.4 (L) 31.5 - 36.0 g/dL   RBC 4.66 3.70 - 5.45 10e6/uL   RDW 16.6 (H) 11.2 - 14.5 %   lymph# 1.6 0.9 - 3.3 10e3/uL   MONO# 0.8 0.1 - 0.9 10e3/uL   Eosinophils Absolute 0.3 0.0 - 0.5 10e3/uL   Basophils Absolute 0.0 0.0 - 0.1 10e3/uL   NEUT% 74.9 38.4 - 76.8 %   LYMPH% 14.8 14.0 - 49.7 %   MONO% 7.6 0.0 - 14.0 %   EOS% 2.6 0.0 - 7.0 %   BASO% 0.1 0.0 - 2.0 %   Retic % 1.48 0.70 - 2.10 %   Retic Ct Abs 68.97 33.70 - 90.70 10e3/uL   Immature Retic Fract 16.60 (H) 1.60 - 10.00 %    Lab Results  Component Value Date   WBC 11.0* 05/14/2015   HGB 9.7* 05/14/2015   HCT 31.9* 05/14/2015   MCV 68.5* 05/14/2015   PLT 392 05/14/2015   ASSESSMENT & PLAN:  Anemia, iron deficiency She has recurrent iron deficiency anemia, likely due to menorrhagia The most likely cause of her anemia is due to chronic blood loss. We discussed some of the risks, benefits, and alternatives of intravenous iron infusions. The patient is symptomatic from anemia and the iron level is critically low. She tolerated oral iron supplement poorly and desires to achieved higher levels of iron faster for adequate hematopoesis. Some of the side-effects to be expected including risks of infusion reactions, phlebitis, headaches, nausea and fatigue.  The patient is willing to proceed. Patient education material was dispensed.  Goal is to keep ferritin level greater than 50 I will see her back in 6 months with repeat blood work. I strongly advise her to discuss with her gynecologist treatment options for menorrhagia.   Barrett's esophagus She had history of duodenitis on EGD in 2015. She is scheduled for surveillance EGD in August 2018. She is on PPI. The patient denies any recent signs or symptoms of bleeding such as spontaneous epistaxis, melena or hematochezia.    All questions were answered. The patient knows to call the clinic with any problems, questions or concerns. No barriers  to learning was detected.  I spent 15 minutes counseling the patient face to face. The total time spent in the appointment was 20 minutes and more than 50% was on counseling.     [facility], Merin Borjon, MD 8/9/20169:21 AM

## 2015-05-14 NOTE — Telephone Encounter (Signed)
Gave and printed appt sched and avs for pt for Aug and Feb

## 2015-05-14 NOTE — Assessment & Plan Note (Signed)
She has recurrent iron deficiency anemia, likely due to menorrhagia The most likely cause of her anemia is due to chronic blood loss. We discussed some of the risks, benefits, and alternatives of intravenous iron infusions. The patient is symptomatic from anemia and the iron level is critically low. She tolerated oral iron supplement poorly and desires to achieved higher levels of iron faster for adequate hematopoesis. Some of the side-effects to be expected including risks of infusion reactions, phlebitis, headaches, nausea and fatigue.  The patient is willing to proceed. Patient education material was dispensed.  Goal is to keep ferritin level greater than 50 I will see her back in 6 months with repeat blood work. I strongly advise her to discuss with her gynecologist treatment options for menorrhagia.

## 2015-05-14 NOTE — Assessment & Plan Note (Signed)
She had history of duodenitis on EGD in 2015. She is scheduled for surveillance EGD in August 2018. She is on PPI. The patient denies any recent signs or symptoms of bleeding such as spontaneous epistaxis, melena or hematochezia.

## 2015-05-21 ENCOUNTER — Ambulatory Visit (HOSPITAL_BASED_OUTPATIENT_CLINIC_OR_DEPARTMENT_OTHER): Payer: Commercial Managed Care - PPO

## 2015-05-21 VITALS — BP 125/71 | HR 77 | Temp 98.2°F | Resp 16

## 2015-05-21 DIAGNOSIS — D509 Iron deficiency anemia, unspecified: Secondary | ICD-10-CM

## 2015-05-21 DIAGNOSIS — N921 Excessive and frequent menstruation with irregular cycle: Secondary | ICD-10-CM

## 2015-05-21 MED ORDER — SODIUM CHLORIDE 0.9 % IV SOLN
Freq: Once | INTRAVENOUS | Status: AC
  Administered 2015-05-21: 08:00:00 via INTRAVENOUS

## 2015-05-21 MED ORDER — SODIUM CHLORIDE 0.9 % IV SOLN
510.0000 mg | Freq: Once | INTRAVENOUS | Status: AC
  Administered 2015-05-21: 510 mg via INTRAVENOUS
  Filled 2015-05-21: qty 17

## 2015-05-21 NOTE — Patient Instructions (Signed)

## 2015-05-28 ENCOUNTER — Ambulatory Visit (HOSPITAL_BASED_OUTPATIENT_CLINIC_OR_DEPARTMENT_OTHER): Payer: Commercial Managed Care - PPO

## 2015-05-28 ENCOUNTER — Encounter: Payer: Self-pay | Admitting: Internal Medicine

## 2015-05-28 VITALS — BP 125/68 | HR 79 | Temp 97.7°F | Resp 16

## 2015-05-28 DIAGNOSIS — N921 Excessive and frequent menstruation with irregular cycle: Secondary | ICD-10-CM | POA: Diagnosis not present

## 2015-05-28 DIAGNOSIS — D509 Iron deficiency anemia, unspecified: Secondary | ICD-10-CM

## 2015-05-28 MED ORDER — SODIUM CHLORIDE 0.9 % IV SOLN
Freq: Once | INTRAVENOUS | Status: AC
  Administered 2015-05-28: 08:00:00 via INTRAVENOUS

## 2015-05-28 MED ORDER — SODIUM CHLORIDE 0.9 % IV SOLN
510.0000 mg | Freq: Once | INTRAVENOUS | Status: AC
  Administered 2015-05-28: 510 mg via INTRAVENOUS
  Filled 2015-05-28: qty 17

## 2015-05-28 NOTE — Patient Instructions (Signed)

## 2015-05-30 ENCOUNTER — Ambulatory Visit: Payer: BLUE CROSS/BLUE SHIELD | Admitting: Obstetrics and Gynecology

## 2015-05-30 ENCOUNTER — Telehealth: Payer: Self-pay | Admitting: Obstetrics and Gynecology

## 2015-05-30 NOTE — Telephone Encounter (Signed)
Patient came in for her appointment today but declined to be seen for her 6 month follow up with Dr. Quincy Simmonds. Patient would like to reschedule.

## 2015-06-06 NOTE — Telephone Encounter (Signed)
Follow-up call to patient regarding appointment from last week. Patient canceled due to account issue. Called back to patient to assist with rescheduling. Patient declines to reschedule. States she plans to seek care elsewhere due to issues with account. Apologized for patient's experience and wished her well.  Routing to provider for final review. Will close encounter.

## 2015-07-21 ENCOUNTER — Other Ambulatory Visit: Payer: Self-pay | Admitting: Internal Medicine

## 2015-07-27 ENCOUNTER — Other Ambulatory Visit: Payer: Self-pay | Admitting: Internal Medicine

## 2015-08-13 ENCOUNTER — Encounter: Payer: Self-pay | Admitting: Obstetrics and Gynecology

## 2015-08-23 ENCOUNTER — Other Ambulatory Visit: Payer: Self-pay | Admitting: Hematology and Oncology

## 2015-09-20 ENCOUNTER — Encounter: Payer: Self-pay | Admitting: Obstetrics and Gynecology

## 2015-10-06 HISTORY — PX: ABDOMINAL HYSTERECTOMY: SHX81

## 2015-11-05 ENCOUNTER — Telehealth: Payer: Self-pay

## 2015-11-05 ENCOUNTER — Other Ambulatory Visit: Payer: Self-pay | Admitting: Hematology and Oncology

## 2015-11-05 DIAGNOSIS — D509 Iron deficiency anemia, unspecified: Secondary | ICD-10-CM

## 2015-11-05 NOTE — Telephone Encounter (Signed)
These are her options:  1) Go to ER to get blood 2) Get admitted for blood per her GYN 3) I can see her latest Thursday at 1045 with labs prior and blood later or Fri at 1 pm with labs prior and blood later. Blood transfusion will take 3 hours minimum. She needs to be aware she needs to take time off work if she wants to be treated her at [facility]. Option 1 will get her blood at any time and option 2 will get her admitted  Orders are in. I did not send POF not knowing what her choice is

## 2015-11-05 NOTE — Telephone Encounter (Signed)
The pt has been bleeding heavy period for the last month. She went to gyn today, the hgb was 7 at her gyn. Her gyn considered admitting pt for blood transfusion but did not do so, saying if it was any lower she would.  Pt is symptomatic SOB, pale, tired. Pt has no insurance until after the 9th. She has a new job so any appts will have to be in the later afternoon. She did say that if she gets worse symptomatically her GYN will admit her for transfusion, or if Dr Alvy Bimler wants to treat her that is OK too.

## 2015-11-06 ENCOUNTER — Telehealth: Payer: Self-pay | Admitting: *Deleted

## 2015-11-06 NOTE — Telephone Encounter (Signed)
Please remind her we can still treat her even without insurance. We can work out an Risk manager Also she has appointment next [employer]. Is she planning to come in then?

## 2015-11-06 NOTE — Telephone Encounter (Signed)
LVM for pt to clarify reason why she cannot make it for appt tomorrow.  Asked her if it is due to lack of insurance then please don't let that stop her from coming in.   We can treat her w/o insurance and we can try to help her apply for assistance.   Also asked if she plans to keep appt next week as scheduled.  Asked her to please call nurse back regarding this message.

## 2015-11-06 NOTE — Telephone Encounter (Signed)
Returned pt's call from yesterday afternoon.  She reported heavy menstrual bleeding and Hgb 7 at her GYN.   Informed her of Dr. Calton Dach message below:  "These are her options: 1) Go to ER to get blood 2) Get admitted for blood per her GYN 3) I can see her latest Thursday at 1045 with labs prior and blood later or Fri at 1 pm with labs prior and blood later. Blood transfusion will take 3 hours minimum. She needs to be aware she needs to take time off work if she wants to be treated her at [facility]. Option 1 will get her blood at any time and option 2 will get her admitted."  Pt states she is not able to come here for any appointments at this time.  She cannot get off work and does not have insurance.  She says she will see how she feels and will go to ED if needed.  Instructed pt to go to ED for any SOB or Chest pains.  Instructed her to call us back if she wants [place] to see her/ schedule her here.   Pt says she will call us back for appointment when she is able.

## 2015-11-12 ENCOUNTER — Other Ambulatory Visit: Payer: Commercial Managed Care - PPO

## 2015-11-12 ENCOUNTER — Ambulatory Visit: Payer: Commercial Managed Care - PPO | Admitting: Hematology and Oncology

## 2015-11-21 ENCOUNTER — Telehealth: Payer: Self-pay | Admitting: *Deleted

## 2015-11-21 NOTE — Telephone Encounter (Signed)
Pt states wants to make appt to get blood transfusion and iron if needed.  She has not had any transfusion yet since we last spoke and her GYN had informed her of Hgb 7 several weeks ago.  She asks if she can come in early Monday morning?  She is off work for another MD appt, but would have to be leave here by 10 am.  Informed pt that would not be enough time for transfusion but we may be able to fit her in for lab and MD visit early Monday then schedule transfusion for the following day if needed.  Pt agreed to this if possible,  She wants to try to miss as little work at her new job as possible.  She is becoming SOB now and that's why she calling for appointment.   Informed her will call her back tomorrow after discussing with Dr. Alvy Bimler.

## 2015-11-22 ENCOUNTER — Ambulatory Visit: Payer: Commercial Managed Care - PPO | Admitting: Hematology and Oncology

## 2015-11-22 ENCOUNTER — Telehealth: Payer: Self-pay | Admitting: Hematology and Oncology

## 2015-11-22 ENCOUNTER — Other Ambulatory Visit: Payer: Self-pay | Admitting: Hematology and Oncology

## 2015-11-22 ENCOUNTER — Telehealth: Payer: Self-pay | Admitting: *Deleted

## 2015-11-22 ENCOUNTER — Other Ambulatory Visit: Payer: Commercial Managed Care - PPO

## 2015-11-22 NOTE — Telephone Encounter (Signed)
per pof tos ch pt appt-sent MW email to sch trmt-will call pt after reply °

## 2015-11-22 NOTE — Telephone Encounter (Signed)
cld pt after reply and adv of blood appt on 2?21@ 12:15

## 2015-11-22 NOTE — Telephone Encounter (Signed)
LVM for pt informing of appts scheduled for Monday and Tuesday next week.  Asked her to please call back to confirm.

## 2015-11-22 NOTE — Telephone Encounter (Signed)
Pt confirmed her appts for Monday 2/20.

## 2015-11-22 NOTE — Telephone Encounter (Signed)
Per staff message and POF I have scheduled appts. Advised scheduler of appts. JMW  

## 2015-11-22 NOTE — Telephone Encounter (Signed)
I placed POF for labs and see her Monday and possible transfusion Tues

## 2015-11-25 ENCOUNTER — Telehealth: Payer: Self-pay | Admitting: Hematology and Oncology

## 2015-11-25 ENCOUNTER — Other Ambulatory Visit: Payer: Self-pay | Admitting: Hematology and Oncology

## 2015-11-25 ENCOUNTER — Encounter: Payer: Self-pay | Admitting: Hematology and Oncology

## 2015-11-25 ENCOUNTER — Other Ambulatory Visit (HOSPITAL_BASED_OUTPATIENT_CLINIC_OR_DEPARTMENT_OTHER): Payer: PRIVATE HEALTH INSURANCE

## 2015-11-25 ENCOUNTER — Ambulatory Visit (HOSPITAL_BASED_OUTPATIENT_CLINIC_OR_DEPARTMENT_OTHER): Payer: PRIVATE HEALTH INSURANCE | Admitting: Hematology and Oncology

## 2015-11-25 VITALS — BP 119/55 | HR 108 | Temp 98.2°F | Resp 18 | Ht 66.0 in | Wt 242.7 lb

## 2015-11-25 DIAGNOSIS — D509 Iron deficiency anemia, unspecified: Secondary | ICD-10-CM

## 2015-11-25 DIAGNOSIS — D500 Iron deficiency anemia secondary to blood loss (chronic): Secondary | ICD-10-CM

## 2015-11-25 DIAGNOSIS — N921 Excessive and frequent menstruation with irregular cycle: Secondary | ICD-10-CM

## 2015-11-25 DIAGNOSIS — K2270 Barrett's esophagus without dysplasia: Secondary | ICD-10-CM | POA: Diagnosis not present

## 2015-11-25 DIAGNOSIS — D573 Sickle-cell trait: Secondary | ICD-10-CM | POA: Insufficient documentation

## 2015-11-25 LAB — CBC & DIFF AND RETIC
BASO%: 0.8 % (ref 0.0–2.0)
BASOS ABS: 0.1 10*3/uL (ref 0.0–0.1)
EOS ABS: 0.1 10*3/uL (ref 0.0–0.5)
EOS%: 0.9 % (ref 0.0–7.0)
HEMATOCRIT: 24.6 % — AB (ref 34.8–46.6)
HEMOGLOBIN: 7.1 g/dL — AB (ref 11.6–15.9)
Immature Retic Fract: 14.9 % — ABNORMAL HIGH (ref 1.60–10.00)
LYMPH%: 8.7 % — AB (ref 14.0–49.7)
MCH: 17.4 pg — AB (ref 25.1–34.0)
MCHC: 28.9 g/dL — ABNORMAL LOW (ref 31.5–36.0)
MCV: 60.3 fL — AB (ref 79.5–101.0)
MONO#: 0.8 10*3/uL (ref 0.1–0.9)
MONO%: 5.5 % (ref 0.0–14.0)
NEUT#: 12.4 10*3/uL — ABNORMAL HIGH (ref 1.5–6.5)
NEUT%: 84.1 % — ABNORMAL HIGH (ref 38.4–76.8)
PLATELETS: 621 10*3/uL — AB (ref 145–400)
RBC: 4.09 10*6/uL (ref 3.70–5.45)
RDW: 17.6 % — AB (ref 11.2–14.5)
RETIC %: 1.63 % (ref 0.70–2.10)
Retic Ct Abs: 66.67 10*3/uL (ref 33.70–90.70)
WBC: 14.7 10*3/uL — ABNORMAL HIGH (ref 3.9–10.3)
lymph#: 1.3 10*3/uL (ref 0.9–3.3)

## 2015-11-25 LAB — FERRITIN: Ferritin: <4 ng/ml — ABNORMAL LOW (ref 9–269)

## 2015-11-25 NOTE — Assessment & Plan Note (Signed)
She had history of duodenitis on EGD in 2015. She is scheduled for surveillance EGD in August 2018. She is on PPI. The patient denies any recent signs or symptoms of bleeding such as spontaneous epistaxis, melena or hematochezia.  

## 2015-11-25 NOTE — Assessment & Plan Note (Signed)
We discussed some of the risks, benefits, and alternatives of blood transfusions. The patient is symptomatic from anemia and the hemoglobin level is critically low.  Some of the side-effects to be expected including risks of transfusion reactions, chills, infection, syndrome of volume overload and risk of hospitalization from various reasons and the patient is willing to proceed and went ahead to sign consent today. I will proceed to give HER-2 units of blood Due to her work schedule, she does not want to interrupt work and she does not want blood transfusion over the next 2 days She requested to be admitted for blood transfusion and I explained to the patient that this is not appropriate reason for admission After long discussion, she is in agreement to return at the end of the week and to get blood transfusion over the weekend Previously, she was not able to tolerate oral iron supplement The cause of anemia is from menorrhagia She will get endometrial ablation next month I will bring her back in April to recheck blood work and depending on the test results, will consider either further blood transfusion or IV iron

## 2015-11-25 NOTE — Telephone Encounter (Signed)
Gave patient avs report and appointments for February and April - dates for lab 2/24 and 4/14 - PRBC's 2/25 per pof. Per pof no return visit.

## 2015-11-25 NOTE — Assessment & Plan Note (Signed)
We discussed options. Apparently, she is scheduled for endometrial ablation next month

## 2015-11-25 NOTE — Progress Notes (Signed)
[name], MD SUMMARY OF HEMATOLOGIC HISTORY:  This is a pleasant lady who is being referred here because of severe iron deficiency anemia and intolerance to oral iron supplement. The cause of her iron deficiency anemia is likely due to excessive menstruation from uterine fibroids. She received one dose of intravenous iron on 10/06/2013 and on 04/25/14. EGD in August 2015 showed duodenitis, esophagitis and Barrett's esophagus INTERVAL HISTORY: Priscilla Solis 45 y.o. female returns for further follow-up. She vented frustration that she is feeling fatigued but insistent she does not want to compromise in terms of taking time off her work to get treatment for anemia She is requesting to be admitted for blood transfusion and I explained to the patient this is not possible She complained of fatigue and shortness of breath on exertion Denies any syncopal episode. She cannot tolerate oral iron supplement She complained of menorrhagia The patient denies any recent signs or symptoms of bleeding such as spontaneous epistaxis, hematuria or hematochezia. She denies excessive reflux  I have reviewed the past medical history, past surgical history, social history and family history with the patient and they are unchanged from previous note.  ALLERGIES:  has No Known Allergies.  MEDICATIONS:  Current Outpatient Prescriptions  Medication Sig Dispense Refill  . cetirizine (ZYRTEC) 10 MG tablet Take 10 mg by mouth daily.    Marland Kitchen ethynodiol-ethinyl estradiol (KELNOR,ZOVIA) 1-35 MG-MCG tablet Take by mouth.    Marland Kitchen glimepiride (AMARYL) 1 MG tablet Take 1 mg by mouth.    . losartan (COZAAR) 50 MG tablet Take 25 mg by mouth daily.    . metFORMIN (GLUCOPHAGE) 1000 MG tablet Take 1,000 mg by mouth 2 (two) times daily.    Marland Kitchen oxyCODONE-acetaminophen (PERCOCET) 10-325 MG per tablet     . pantoprazole (PROTONIX) 40 MG tablet Take 1 tablet (40 mg total) by mouth  daily. 90 tablet 3  . potassium chloride SA (K-DUR,KLOR-CON) 20 MEQ tablet Take 20 mEq by mouth as needed.     . ranitidine (ZANTAC) 300 MG tablet Take 300 mg by mouth at bedtime.     . simvastatin (ZOCOR) 10 MG tablet Take 10 mg by mouth daily.    . hydrochlorothiazide (HYDRODIURIL) 25 MG tablet Take 25 mg by mouth as needed (during menstrual cycle).      No current facility-administered medications for this visit.     REVIEW OF SYSTEMS:   Constitutional: Denies fevers, chills or night sweats Eyes: Denies blurriness of vision Ears, nose, mouth, throat, and face: Denies mucositis or sore throat Cardiovascular: Denies palpitation, chest discomfort or lower extremity swelling Gastrointestinal:  Denies nausea, heartburn or change in bowel habits Skin: Denies abnormal skin rashes Lymphatics: Denies new lymphadenopathy or easy bruising Neurological:Denies numbness, tingling or new weaknesses Behavioral/Psych: Mood is stable, no new changes  All other systems were reviewed with the patient and are negative.  PHYSICAL EXAMINATION: ECOG PERFORMANCE STATUS: 1 - Symptomatic but completely ambulatory  Filed Vitals:   11/25/15 0850  BP: 119/55  Pulse: 108  Temp: 98.2 F (36.8 C)  Resp: 18   Filed Weights   11/25/15 0850  Weight: 242 lb 11.2 oz (110.088 kg)    GENERAL:alert, no distress and comfortable. She is obese. She appears very frustrated  SKIN: skin color, texture, turgor are normal, no rashes or significant lesions EYES: normal, Conjunctiva are pale and non-injected, sclera clear Musculoskeletal:no cyanosis of digits and no clubbing  NEURO: alert & oriented x 3 with fluent  speech, no focal motor/sensory deficits  LABORATORY DATA:  I have reviewed the data as listed Results for orders placed or performed in visit on 11/25/15 (from the past 48 hour(s))  CBC & Diff and Retic     Status: Abnormal   Collection Time: 11/25/15  8:35 AM  Result Value Ref Range   WBC 14.7 (H) 3.9 -  10.3 10e3/uL   NEUT# 12.4 (H) 1.5 - 6.5 10e3/uL   HGB 7.1 (L) 11.6 - 15.9 g/dL   HCT 24.6 (L) 34.8 - 46.6 %   Platelets 621 (H) 145 - 400 10e3/uL   MCV 60.3 (L) 79.5 - 101.0 fL   MCH 17.4 (L) 25.1 - 34.0 pg   MCHC 28.9 (L) 31.5 - 36.0 g/dL   RBC 4.09 3.70 - 5.45 10e6/uL   RDW 17.6 (H) 11.2 - 14.5 %   lymph# 1.3 0.9 - 3.3 10e3/uL   MONO# 0.8 0.1 - 0.9 10e3/uL   Eosinophils Absolute 0.1 0.0 - 0.5 10e3/uL   Basophils Absolute 0.1 0.0 - 0.1 10e3/uL   NEUT% 84.1 (H) 38.4 - 76.8 %   LYMPH% 8.7 (L) 14.0 - 49.7 %   MONO% 5.5 0.0 - 14.0 %   EOS% 0.9 0.0 - 7.0 %   BASO% 0.8 0.0 - 2.0 %   Retic % 1.63 0.70 - 2.10 %   Retic Ct Abs 66.67 33.70 - 90.70 10e3/uL   Immature Retic Fract 14.90 (H) 1.60 - 10.00 %    Lab Results  Component Value Date   WBC 14.7* 11/25/2015   HGB 7.1* 11/25/2015   HCT 24.6* 11/25/2015   MCV 60.3* 11/25/2015   PLT 621* 11/25/2015   ASSESSMENT & PLAN:  Anemia, iron deficiency We discussed some of the risks, benefits, and alternatives of blood transfusions. The patient is symptomatic from anemia and the hemoglobin level is critically low.  Some of the side-effects to be expected including risks of transfusion reactions, chills, infection, syndrome of volume overload and risk of hospitalization from various reasons and the patient is willing to proceed and went ahead to sign consent today. I will proceed to give HER-2 units of blood Due to her work schedule, she does not want to interrupt work and she does not want blood transfusion over the next 2 days She requested to be admitted for blood transfusion and I explained to the patient that this is not appropriate reason for admission After long discussion, she is in agreement to return at the end of the week and to get blood transfusion over the weekend Previously, she was not able to tolerate oral iron supplement The cause of anemia is from menorrhagia She will get endometrial ablation next month I will bring her back  in April to recheck blood work and depending on the test results, will consider either further blood transfusion or IV iron  Menorrhagia We discussed options. Apparently, she is scheduled for endometrial ablation next month  Barrett's esophagus She had history of duodenitis on EGD in 2015. She is scheduled for surveillance EGD in August 2018. She is on PPI. The patient denies any recent signs or symptoms of bleeding such as spontaneous epistaxis, melena or hematochezia.    All questions were answered. The patient knows to call the clinic with any problems, questions or concerns. No barriers to learning was detected.  I spent 15 minutes counseling the patient face to face. The total time spent in the appointment was 20 minutes and more than 50% was on counseling.  [facility], Sindy Mccune, MD 2/20/20179:33 AM

## 2015-11-29 ENCOUNTER — Ambulatory Visit (HOSPITAL_COMMUNITY)
Admission: RE | Admit: 2015-11-29 | Discharge: 2015-11-29 | Disposition: A | Discharge status: Home / Self Care | Payer: PRIVATE HEALTH INSURANCE | Source: Ambulatory Visit | Attending: Hematology and Oncology | Admitting: Hematology and Oncology

## 2015-11-29 ENCOUNTER — Other Ambulatory Visit: Payer: Self-pay | Admitting: *Deleted

## 2015-11-29 ENCOUNTER — Other Ambulatory Visit (HOSPITAL_BASED_OUTPATIENT_CLINIC_OR_DEPARTMENT_OTHER): Payer: PRIVATE HEALTH INSURANCE

## 2015-11-29 DIAGNOSIS — D509 Iron deficiency anemia, unspecified: Secondary | ICD-10-CM

## 2015-11-29 LAB — CBC & DIFF AND RETIC
BASO%: 0.1 % (ref 0.0–2.0)
BASOS ABS: 0 10*3/uL (ref 0.0–0.1)
EOS ABS: 0.2 10*3/uL (ref 0.0–0.5)
EOS%: 1.3 % (ref 0.0–7.0)
HEMATOCRIT: 24.9 % — AB (ref 34.8–46.6)
HEMOGLOBIN: 7 g/dL — AB (ref 11.6–15.9)
IMMATURE RETIC FRACT: 15.6 % — AB (ref 1.60–10.00)
LYMPH%: 13.2 % — AB (ref 14.0–49.7)
MCH: 17.5 pg — ABNORMAL LOW (ref 25.1–34.0)
MCHC: 28.1 g/dL — ABNORMAL LOW (ref 31.5–36.0)
MCV: 62.4 fL — AB (ref 79.5–101.0)
MONO#: 0.8 10*3/uL (ref 0.1–0.9)
MONO%: 6.9 % (ref 0.0–14.0)
NEUT#: 8.9 10*3/uL — ABNORMAL HIGH (ref 1.5–6.5)
NEUT%: 78.5 % — AB (ref 38.4–76.8)
PLATELETS: 542 10*3/uL — AB (ref 145–400)
RBC: 3.99 10*6/uL (ref 3.70–5.45)
RDW: 16.5 % — AB (ref 11.2–14.5)
RETIC CT ABS: 71.42 10*3/uL (ref 33.70–90.70)
Retic %: 1.79 % (ref 0.70–2.10)
WBC: 11.3 10*3/uL — ABNORMAL HIGH (ref 3.9–10.3)
lymph#: 1.5 10*3/uL (ref 0.9–3.3)

## 2015-11-29 LAB — ABO/RH: ABO/RH(D): O POS

## 2015-11-29 LAB — PREPARE RBC (CROSSMATCH)

## 2015-11-30 ENCOUNTER — Ambulatory Visit (HOSPITAL_BASED_OUTPATIENT_CLINIC_OR_DEPARTMENT_OTHER): Payer: PRIVATE HEALTH INSURANCE

## 2015-11-30 VITALS — BP 122/56 | HR 93 | Temp 98.5°F | Resp 18

## 2015-11-30 DIAGNOSIS — D509 Iron deficiency anemia, unspecified: Secondary | ICD-10-CM

## 2015-11-30 MED ORDER — DIPHENHYDRAMINE HCL 25 MG PO CAPS
25.0000 mg | ORAL_CAPSULE | Freq: Once | ORAL | Status: AC
  Administered 2015-11-30: 25 mg via ORAL

## 2015-11-30 MED ORDER — DIPHENHYDRAMINE HCL 25 MG PO CAPS
ORAL_CAPSULE | ORAL | Status: AC
  Filled 2015-11-30: qty 1

## 2015-11-30 MED ORDER — SODIUM CHLORIDE 0.9 % IV SOLN
250.0000 mL | Freq: Once | INTRAVENOUS | Status: AC
  Administered 2015-11-30: 250 mL via INTRAVENOUS

## 2015-11-30 MED ORDER — ACETAMINOPHEN 325 MG PO TABS
650.0000 mg | ORAL_TABLET | Freq: Once | ORAL | Status: AC
Start: 2015-11-30 — End: 2015-11-30
  Administered 2015-11-30: 650 mg via ORAL

## 2015-11-30 MED ORDER — ACETAMINOPHEN 325 MG PO TABS
ORAL_TABLET | ORAL | Status: AC
  Filled 2015-11-30: qty 2

## 2015-11-30 NOTE — Patient Instructions (Signed)

## 2015-12-02 ENCOUNTER — Other Ambulatory Visit: Payer: Self-pay | Admitting: Hematology and Oncology

## 2015-12-02 LAB — TYPE AND SCREEN
ABO/RH(D): O POS
Antibody Screen: NEGATIVE
UNIT DIVISION: 0
UNIT DIVISION: 0

## 2015-12-25 DIAGNOSIS — IMO0001 Reserved for inherently not codable concepts without codable children: Secondary | ICD-10-CM | POA: Insufficient documentation

## 2015-12-25 DIAGNOSIS — E669 Obesity, unspecified: Secondary | ICD-10-CM | POA: Insufficient documentation

## 2015-12-25 DIAGNOSIS — Z803 Family history of malignant neoplasm of breast: Secondary | ICD-10-CM | POA: Insufficient documentation

## 2015-12-25 DIAGNOSIS — R609 Edema, unspecified: Secondary | ICD-10-CM | POA: Insufficient documentation

## 2015-12-25 DIAGNOSIS — J3081 Allergic rhinitis due to animal (cat) (dog) hair and dander: Secondary | ICD-10-CM | POA: Insufficient documentation

## 2015-12-27 DIAGNOSIS — R300 Dysuria: Secondary | ICD-10-CM | POA: Insufficient documentation

## 2016-01-01 ENCOUNTER — Telehealth: Payer: Self-pay | Admitting: *Deleted

## 2016-01-01 NOTE — Telephone Encounter (Signed)
VM from [place] at [facility] (ph# [phone] ext. [phone]) wants to refer pt back to see Dr. Alvy Bimler.   I called back and left VM asking them to fax any labs/ office notes and reason pt needs to be seen sooner.

## 2016-01-03 ENCOUNTER — Telehealth: Payer: Self-pay | Admitting: *Deleted

## 2016-01-03 NOTE — Telephone Encounter (Signed)
Notes received from Lorelee Market, NP, at [facility].  They report pt's Ferritin 4 and Hgb 9.2 and request pt be seen by Dr. Alvy Bimler for Iron infusion.  Per Dr. Alvy Bimler call pt to schedule IV iron infusions at pt's convenience.   Called pt and informed her of above.  Pt replied that she is going to have a Hysterectomy in the next few weeks.  She does not want to schedule IV iron at this time.  She says she will call us back after her Pre-Op visit when she decides what she wants to do.   I confirmed w/ pt she is aware of her low iron levels.  Confirmed w/ pt she will call us when and if she is ready to make appointment.  Notified Nurse Shirlean Mylar at [facility] of above.

## 2016-01-06 ENCOUNTER — Telehealth: Payer: Self-pay | Admitting: Hematology and Oncology

## 2016-01-06 ENCOUNTER — Telehealth: Payer: Self-pay | Admitting: *Deleted

## 2016-01-06 ENCOUNTER — Other Ambulatory Visit: Payer: Self-pay | Admitting: Hematology and Oncology

## 2016-01-06 NOTE — Telephone Encounter (Signed)
Pt states she wants to schedule IV Feraheme for next Monday 4/17 at 4 pm and wants second dose two weeks later on Monday 5/1 at 4 pm..  She is scheduled for Hysterectomy on 5/5.   POF sent to Scheduler to requests these dates/times.  Informed pt that Scheduler will call her to confirm.  She verbalized understanding.

## 2016-01-06 NOTE — Telephone Encounter (Signed)
I placed orders for feraheme

## 2016-01-06 NOTE — Telephone Encounter (Signed)
pt called to sched appt...done...pt ok and aware of d.t °

## 2016-01-13 ENCOUNTER — Ambulatory Visit (HOSPITAL_BASED_OUTPATIENT_CLINIC_OR_DEPARTMENT_OTHER): Payer: PRIVATE HEALTH INSURANCE

## 2016-01-13 VITALS — BP 98/71 | HR 92 | Temp 98.9°F | Resp 18

## 2016-01-13 DIAGNOSIS — D509 Iron deficiency anemia, unspecified: Secondary | ICD-10-CM

## 2016-01-13 DIAGNOSIS — N921 Excessive and frequent menstruation with irregular cycle: Secondary | ICD-10-CM

## 2016-01-13 DIAGNOSIS — N920 Excessive and frequent menstruation with regular cycle: Secondary | ICD-10-CM | POA: Diagnosis not present

## 2016-01-13 MED ORDER — SODIUM CHLORIDE 0.9 % IV SOLN
510.0000 mg | Freq: Once | INTRAVENOUS | Status: AC
  Administered 2016-01-13: 510 mg via INTRAVENOUS
  Filled 2016-01-13: qty 17

## 2016-01-13 MED ORDER — SODIUM CHLORIDE 0.9 % IV SOLN
Freq: Once | INTRAVENOUS | Status: AC
  Administered 2016-01-13: 16:00:00 via INTRAVENOUS

## 2016-01-13 NOTE — Patient Instructions (Signed)

## 2016-01-17 ENCOUNTER — Telehealth: Payer: Self-pay | Admitting: Hematology and Oncology

## 2016-01-17 ENCOUNTER — Other Ambulatory Visit: Payer: PRIVATE HEALTH INSURANCE

## 2016-01-17 NOTE — Telephone Encounter (Signed)
pt called to cx labs....she had pre-op that did labs...pt stated that they took the blood that she needs to get iron...labs cx per pt request

## 2016-01-29 ENCOUNTER — Telehealth: Payer: Self-pay | Admitting: *Deleted

## 2016-01-29 ENCOUNTER — Other Ambulatory Visit: Payer: Self-pay | Admitting: Hematology and Oncology

## 2016-01-29 NOTE — Telephone Encounter (Signed)
Attempted to call patient, no answer 

## 2016-01-29 NOTE — Telephone Encounter (Signed)
-----   Message from Heath Lark, MD sent at 01/29/2016  2:54 PM EDT ----- Regarding: recheck labs She is coming in on Friday for second dose iv iron Can you ask her when would work well for her to come in and see me? Would recommend recheck within 2 months and then place POF with labs appt. Need only 15 mins appt

## 2016-01-31 ENCOUNTER — Ambulatory Visit (HOSPITAL_BASED_OUTPATIENT_CLINIC_OR_DEPARTMENT_OTHER): Payer: PRIVATE HEALTH INSURANCE

## 2016-01-31 VITALS — BP 129/48 | HR 105 | Temp 97.5°F | Resp 17

## 2016-01-31 DIAGNOSIS — N921 Excessive and frequent menstruation with irregular cycle: Secondary | ICD-10-CM | POA: Diagnosis not present

## 2016-01-31 DIAGNOSIS — D500 Iron deficiency anemia secondary to blood loss (chronic): Secondary | ICD-10-CM | POA: Diagnosis not present

## 2016-01-31 DIAGNOSIS — D509 Iron deficiency anemia, unspecified: Secondary | ICD-10-CM

## 2016-01-31 MED ORDER — SODIUM CHLORIDE 0.9 % IV SOLN
510.0000 mg | Freq: Once | INTRAVENOUS | Status: AC
  Administered 2016-01-31: 510 mg via INTRAVENOUS
  Filled 2016-01-31: qty 17

## 2016-01-31 MED ORDER — SODIUM CHLORIDE 0.9 % IV SOLN
Freq: Once | INTRAVENOUS | Status: AC
  Administered 2016-01-31: 17:00:00 via INTRAVENOUS

## 2016-01-31 NOTE — Patient Instructions (Signed)

## 2016-02-28 DIAGNOSIS — Z90710 Acquired absence of both cervix and uterus: Secondary | ICD-10-CM | POA: Insufficient documentation

## 2016-03-19 ENCOUNTER — Other Ambulatory Visit: Payer: Self-pay | Admitting: Hematology and Oncology

## 2016-04-06 ENCOUNTER — Other Ambulatory Visit: Payer: Self-pay | Admitting: Nurse Practitioner

## 2017-03-11 ENCOUNTER — Encounter: Payer: Self-pay | Admitting: *Deleted

## 2017-03-31 ENCOUNTER — Encounter: Payer: Self-pay | Admitting: Internal Medicine

## 2018-01-24 DIAGNOSIS — Z9049 Acquired absence of other specified parts of digestive tract: Secondary | ICD-10-CM | POA: Insufficient documentation

## 2018-02-17 ENCOUNTER — Encounter: Payer: Self-pay | Admitting: Internal Medicine

## 2018-02-22 DIAGNOSIS — E559 Vitamin D deficiency, unspecified: Secondary | ICD-10-CM | POA: Insufficient documentation

## 2018-04-18 ENCOUNTER — Encounter: Payer: Self-pay | Admitting: Internal Medicine

## 2018-04-27 ENCOUNTER — Emergency Department (HOSPITAL_COMMUNITY)
Admission: EM | Admit: 2018-04-27 | Discharge: 2018-04-27 | Disposition: A | Discharge status: Home / Self Care | Payer: PRIVATE HEALTH INSURANCE | Attending: Emergency Medicine | Admitting: Emergency Medicine

## 2018-04-27 ENCOUNTER — Other Ambulatory Visit: Payer: Self-pay

## 2018-04-27 ENCOUNTER — Encounter (HOSPITAL_COMMUNITY): Payer: Self-pay

## 2018-04-27 DIAGNOSIS — Y998 Other external cause status: Secondary | ICD-10-CM | POA: Diagnosis not present

## 2018-04-27 DIAGNOSIS — Z79899 Other long term (current) drug therapy: Secondary | ICD-10-CM | POA: Diagnosis not present

## 2018-04-27 DIAGNOSIS — Y939 Activity, unspecified: Secondary | ICD-10-CM | POA: Diagnosis not present

## 2018-04-27 DIAGNOSIS — D573 Sickle-cell trait: Secondary | ICD-10-CM | POA: Insufficient documentation

## 2018-04-27 DIAGNOSIS — I10 Essential (primary) hypertension: Secondary | ICD-10-CM | POA: Diagnosis not present

## 2018-04-27 DIAGNOSIS — E119 Type 2 diabetes mellitus without complications: Secondary | ICD-10-CM | POA: Diagnosis not present

## 2018-04-27 DIAGNOSIS — M542 Cervicalgia: Secondary | ICD-10-CM | POA: Diagnosis not present

## 2018-04-27 DIAGNOSIS — Y92410 Unspecified street and highway as the place of occurrence of the external cause: Secondary | ICD-10-CM | POA: Diagnosis not present

## 2018-04-27 MED ORDER — CYCLOBENZAPRINE HCL 10 MG PO TABS: 10.0000 mg | ORAL_TABLET | Freq: Two times a day (BID) | ORAL | 0 refills | Status: DC | PRN
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
## 2018-05-03 ENCOUNTER — Encounter: Payer: PRIVATE HEALTH INSURANCE | Admitting: Internal Medicine

## 2018-06-22 ENCOUNTER — Encounter: Payer: PRIVATE HEALTH INSURANCE | Admitting: Internal Medicine

## 2018-07-29 ENCOUNTER — Encounter: Payer: Self-pay | Admitting: Internal Medicine

## 2018-07-29 ENCOUNTER — Ambulatory Visit (AMBULATORY_SURGERY_CENTER): Payer: Self-pay

## 2018-07-29 VITALS — Ht 68.0 in | Wt 243.6 lb

## 2018-07-29 DIAGNOSIS — K22719 Barrett's esophagus with dysplasia, unspecified: Secondary | ICD-10-CM

## 2018-07-29 NOTE — Progress Notes (Signed)
No egg or soy allergy known to patient  No issues with past sedation with any surgeries  or procedures, no intubation problems  No diet pills per patient No home 02 use per patient  No blood thinners per patient  Pt denies issues with constipation  No A fib or A flutter  EMMI video sent to pt's e mail  

## 2018-08-12 ENCOUNTER — Encounter: Payer: Self-pay | Admitting: Internal Medicine

## 2018-08-12 ENCOUNTER — Ambulatory Visit (AMBULATORY_SURGERY_CENTER): Payer: PRIVATE HEALTH INSURANCE | Admitting: Internal Medicine

## 2018-08-12 VITALS — BP 113/80 | HR 77 | Temp 98.2°F | Resp 10 | Ht 68.0 in | Wt 243.0 lb

## 2018-08-12 DIAGNOSIS — K317 Polyp of stomach and duodenum: Secondary | ICD-10-CM | POA: Diagnosis not present

## 2018-08-12 DIAGNOSIS — K229 Disease of esophagus, unspecified: Secondary | ICD-10-CM | POA: Diagnosis not present

## 2018-08-12 DIAGNOSIS — K22719 Barrett's esophagus with dysplasia, unspecified: Secondary | ICD-10-CM

## 2018-08-12 MED ORDER — FAMOTIDINE 20 MG PO TABS: 20.0000 mg | ORAL_TABLET | Freq: Two times a day (BID) | ORAL | 3 refills | Status: DC

## 2018-08-12 MED ORDER — SODIUM CHLORIDE 0.9 % IV SOLN: 500.0000 mL | Freq: Once | INTRAVENOUS | Status: DC

## 2018-08-12 NOTE — Progress Notes (Signed)
Pt's states no medical or surgical changes since previsit or office visit. 

## 2018-08-12 NOTE — Progress Notes (Signed)
Called to room to assist during endoscopic procedure.  Patient ID and intended procedure confirmed with present staff. Received instructions for my participation in the procedure from the performing physician.  

## 2018-08-12 NOTE — Patient Instructions (Signed)
YOU HAD AN ENDOSCOPIC PROCEDURE TODAY AT THE [facility]:   Refer to the procedure report that was given to you for any specific questions about what was found during the examination.  If the procedure report does not answer your questions, please call your gastroenterologist to clarify.  If you requested that your care partner not be given the details of your procedure findings, then the procedure report has been included in a sealed envelope for you to review at your convenience later.  YOU SHOULD EXPECT: Some feelings of bloating in the abdomen. Passage of more gas than usual.  Walking can help get rid of the air that was put into your GI tract during the procedure and reduce the bloating. If you had a lower endoscopy (such as a colonoscopy or flexible sigmoidoscopy) you may notice spotting of blood in your stool or on the toilet paper. If you underwent a bowel prep for your procedure, you may not have a normal bowel movement for a few days.  Please Note:  You might notice some irritation and congestion in your nose or some drainage.  This is from the oxygen used during your procedure.  There is no need for concern and it should clear up in a day or so.  SYMPTOMS TO REPORT IMMEDIATELY:   Following upper endoscopy (EGD)  Vomiting of blood or coffee ground material  New chest pain or pain under the shoulder blades  Painful or persistently difficult swallowing  New shortness of breath  Fever of 100F or higher  Black, tarry-looking stools  For urgent or emergent issues, a gastroenterologist can be reached at any hour by calling [phone].   DIET:  We do recommend a small meal at first, but then you may proceed to your regular diet.  Drink plenty of fluids but you should avoid alcoholic beverages for 24 hours.  ACTIVITY:  You should plan to take it easy for the rest of today and you should NOT DRIVE or use heavy machinery until tomorrow (because of the sedation medicines used  during the test).    FOLLOW UP: Our staff will call the number listed on your records the next business day following your procedure to check on you and address any questions or concerns that you may have regarding the information given to you following your procedure. If we do not reach you, we will leave a message.  However, if you are feeling well and you are not experiencing any problems, there is no need to return our call.  We will assume that you have returned to your regular daily activities without incident.  If any biopsies were taken you will be contacted by phone or by letter within the next 1-3 weeks.  Please call us at [phone] if you have not heard about the biopsies in 3 weeks.    SIGNATURES/CONFIDENTIALITY: You and/or your care partner have signed paperwork which will be entered into your electronic medical record.  These signatures attest to the fact that that the information above on your After Visit Summary has been reviewed and is understood.  Full responsibility of the confidentiality of this discharge information lies with you and/or your care-partner. 

## 2018-08-12 NOTE — Progress Notes (Signed)
PT taken to PACU. Monitors in place. VSS. Report given to RN. 

## 2018-08-12 NOTE — Op Note (Signed)
[facility] Patient Name: Priscilla Solis Procedure Date: 08/12/2018 10:25 AM MRN: [id number] Endoscopist: Jerene Bears , MD Age: 47 Referring MD:  Date of Birth: 1971/06/27 Gender: Female Account #: [id number] Procedure:                Upper GI endoscopy Indications:              Follow-up of Barrett's esophagus (diagnosis 2015                            without dysplasia), last EGD 2015 Medicines:                Monitored Anesthesia Care Procedure:                Pre-Anesthesia Assessment:                           - Prior to the procedure, a History and Physical                            was performed, and patient medications and                            allergies were reviewed. The patient's tolerance of                            previous anesthesia was also reviewed. The risks                            and benefits of the procedure and the sedation                            options and risks were discussed with the patient.                            All questions were answered, and informed consent                            was obtained. Prior Anticoagulants: The patient has                            taken no previous anticoagulant or antiplatelet                            agents. ASA Grade Assessment: II - A patient with                            mild systemic disease. After reviewing the risks                            and benefits, the patient was deemed in                            satisfactory condition to undergo the procedure.  After obtaining informed consent, the endoscope was                            passed under direct vision. Throughout the                            procedure, the patient's blood pressure, pulse, and                            oxygen saturations were monitored continuously. The                            Endoscope was introduced through the mouth, and                            advanced to the second  part of duodenum. The upper                            GI endoscopy was accomplished without difficulty.                            The patient tolerated the procedure well. Scope In: Scope Out: Findings:                 The Z-line was slightly irregular and was found 40                            cm from the incisors. This was biopsied in targeted                            fashion with a cold forceps for histology and                            evaluation to rule out Barrett's Esophagus. If                            Barrett's then very short segment (C0M0.5)                           A few small (< 10 mm) sessile polyps were found in                            the cardia, in the gastric fundus and in the                            gastric body. These are benign and consistent with                            fundic gland polyps.                           The exam of the stomach was otherwise normal.  The examined duodenum was normal. Complications:            No immediate complications. Estimated Blood Loss:     Estimated blood loss was minimal. Impression:               - Z-line irregular, 40 cm from the incisors.                            Biopsied.                           - A few, benign, fundic gland gastric polyps.                           - Normal examined duodenum. Recommendation:           - Patient has a contact number available for                            emergencies. The signs and symptoms of potential                            delayed complications were discussed with the                            patient. Return to normal activities tomorrow.                            Written discharge instructions were provided to the                            patient.                           - Resume previous diet.                           - Continue present medications.                           - Await pathology results.                            - Repeat upper endoscopy after studies are complete                            for surveillance based on pathology results. Jerene Bears, MD 08/12/2018 10:45:39 AM This report has been signed electronically.

## 2018-08-15 ENCOUNTER — Telehealth: Payer: Self-pay | Admitting: *Deleted

## 2018-08-15 ENCOUNTER — Telehealth: Payer: Self-pay

## 2018-08-15 NOTE — Telephone Encounter (Signed)
First attempt, no answer- left VM.  

## 2018-08-15 NOTE — Telephone Encounter (Signed)
  Follow up Call-  Call back number 08/12/2018  Post procedure Call Back phone  # [phone]  Permission to leave phone message Yes  Some recent data might be hidden     Patient questions:  Do you have a fever, pain , or abdominal swelling? No. Pain Score  0 *  Have you tolerated food without any problems? Yes.    Have you been able to return to your normal activities? Yes.    Do you have any questions about your discharge instructions: Diet   No. Medications  No. Follow up visit  No.  Do you have questions or concerns about your Care? No.  Actions: * If pain score is 4 or above: No action needed, pain <4.

## 2018-08-22 ENCOUNTER — Encounter: Payer: Self-pay | Admitting: Internal Medicine

## 2019-02-19 ENCOUNTER — Other Ambulatory Visit: Payer: Self-pay | Admitting: Internal Medicine

## 2019-02-20 ENCOUNTER — Telehealth: Payer: Self-pay | Admitting: Internal Medicine

## 2019-02-20 ENCOUNTER — Other Ambulatory Visit: Payer: Self-pay | Admitting: Internal Medicine

## 2019-02-20 MED ORDER — PANTOPRAZOLE SODIUM 40 MG PO TBEC: 40.0000 mg | DELAYED_RELEASE_TABLET | Freq: Every day | ORAL | 1 refills | Status: DC

## 2019-02-20 NOTE — Telephone Encounter (Signed)
Pt called to advised that the pharmacy is out of the medication and wanting to know is there an substitute that can be called in for the her. Or if she can go back to the protonix and if so a prescription will need to be called in please call pt to advise thanks,

## 2019-02-20 NOTE — Telephone Encounter (Signed)
Ok, thanks.

## 2019-02-20 NOTE — Telephone Encounter (Signed)
Patient indicates that per pharmacy, famotidine is no longer readily available due to ranitidine recall and people switching over to famotidine from that medication. Patient states that famotidine has done an okay job on controlling her heartburn symptoms, however, pantoprazole did a better job. She would like to switch back to pantoprazole since it does a better job at symptom control and is easily accessible to her. Per Dr Vena Rua 08/2018 letter, patient may restart pantoprazole if needed. Patient advised I will send pantoprazole to pharmacy in place of famotidine.

## 2019-03-08 DIAGNOSIS — L731 Pseudofolliculitis barbae: Secondary | ICD-10-CM | POA: Insufficient documentation

## 2019-03-08 DIAGNOSIS — L680 Hirsutism: Secondary | ICD-10-CM | POA: Insufficient documentation

## 2019-03-08 DIAGNOSIS — L810 Postinflammatory hyperpigmentation: Secondary | ICD-10-CM | POA: Insufficient documentation

## 2019-04-13 ENCOUNTER — Telehealth: Payer: Self-pay | Admitting: Internal Medicine

## 2019-04-13 NOTE — Telephone Encounter (Signed)
Pt states she has been taking protonix daily in the am. She started taking this as her pharmacy was not able to get pepcid that she had been taking. Reports this is not working as well. Last night pt states she was awakened by terrible indigestion, she had to get up and drink ginger ale to get some relief. Pt states she either needs something else or perhaps take the protonix bid. She states she can try a different pharmacy for the pepcid also if he feels she needs to take it again. Please advise.

## 2019-04-13 NOTE — Telephone Encounter (Signed)
Pt states that pantoprazole is not working well as she had very bad acid reflux last night that kept her awake. She would like a call.

## 2019-04-17 NOTE — Telephone Encounter (Signed)
Spoke with pt and she states she was able to find pepcid at [facility] and it is working, states she will continue with the pepcid. She knows to call with any problems.

## 2019-04-17 NOTE — Telephone Encounter (Signed)
Would increase pantoprazole to 40 mg BID-AC for now, after 1 month ask that she let me know how her symptoms of GERD are doing with this new dose If resolved, we may try to reduce back to once daily dosing.  Pepcid should become more available over time (strained with ranitidine being off the market and the COVID 19 pandemic)

## 2019-07-10 DIAGNOSIS — M21611 Bunion of right foot: Secondary | ICD-10-CM | POA: Insufficient documentation

## 2019-07-10 DIAGNOSIS — M7752 Other enthesopathy of left foot: Secondary | ICD-10-CM | POA: Insufficient documentation

## 2019-12-05 DIAGNOSIS — E538 Deficiency of other specified B group vitamins: Secondary | ICD-10-CM | POA: Insufficient documentation

## 2020-04-30 ENCOUNTER — Other Ambulatory Visit (HOSPITAL_BASED_OUTPATIENT_CLINIC_OR_DEPARTMENT_OTHER): Payer: Self-pay | Admitting: Family Medicine

## 2020-08-14 ENCOUNTER — Other Ambulatory Visit (HOSPITAL_BASED_OUTPATIENT_CLINIC_OR_DEPARTMENT_OTHER): Payer: Self-pay | Admitting: Family Medicine

## 2020-11-11 MED FILL — OZEMPIC (1 MG/DOSE) 4 MG/3M: 4 | 56 days supply | Qty: 6 | Fill #0

## 2020-11-13 ENCOUNTER — Ambulatory Visit: Payer: PRIVATE HEALTH INSURANCE | Admitting: Nurse Practitioner

## 2020-11-14 MED FILL — METFORMIN HCL 1000 MG TABS: 1000 | 90 days supply | Qty: 180 | Fill #0

## 2020-11-20 ENCOUNTER — Other Ambulatory Visit (HOSPITAL_BASED_OUTPATIENT_CLINIC_OR_DEPARTMENT_OTHER): Payer: Self-pay | Admitting: Family Medicine

## 2020-11-20 DIAGNOSIS — I10 Essential (primary) hypertension: Secondary | ICD-10-CM | POA: Diagnosis not present

## 2020-11-20 DIAGNOSIS — E1129 Type 2 diabetes mellitus with other diabetic kidney complication: Secondary | ICD-10-CM | POA: Diagnosis not present

## 2020-11-20 DIAGNOSIS — Z79899 Other long term (current) drug therapy: Secondary | ICD-10-CM | POA: Diagnosis not present

## 2020-11-20 DIAGNOSIS — R809 Proteinuria, unspecified: Secondary | ICD-10-CM | POA: Diagnosis not present

## 2020-11-20 DIAGNOSIS — Z6837 Body mass index (BMI) 37.0-37.9, adult: Secondary | ICD-10-CM | POA: Diagnosis not present

## 2020-11-20 DIAGNOSIS — G44329 Chronic post-traumatic headache, not intractable: Secondary | ICD-10-CM | POA: Diagnosis not present

## 2020-11-20 DIAGNOSIS — J4520 Mild intermittent asthma, uncomplicated: Secondary | ICD-10-CM | POA: Diagnosis not present

## 2020-11-20 MED FILL — SIMVASTATIN 40 MG TABS: 40 | 90 days supply | Qty: 90 | Fill #0

## 2020-11-25 ENCOUNTER — Other Ambulatory Visit (HOSPITAL_BASED_OUTPATIENT_CLINIC_OR_DEPARTMENT_OTHER): Payer: Self-pay | Admitting: Family Medicine

## 2020-11-25 MED FILL — FLUCONAZOLE 150 MG TABS: 150 | 1 days supply | Qty: 1 | Fill #0

## 2020-12-02 MED FILL — OXYCODONE-APAP 10-325: 10-325 | 20 days supply | Qty: 120 | Fill #0

## 2020-12-08 ENCOUNTER — Encounter (HOSPITAL_BASED_OUTPATIENT_CLINIC_OR_DEPARTMENT_OTHER): Payer: Self-pay | Admitting: Emergency Medicine

## 2020-12-08 ENCOUNTER — Emergency Department (HOSPITAL_BASED_OUTPATIENT_CLINIC_OR_DEPARTMENT_OTHER)
Admission: EM | Admit: 2020-12-08 | Discharge: 2020-12-08 | Disposition: A | Discharge status: Home / Self Care | Payer: 59 | Attending: Emergency Medicine | Admitting: Emergency Medicine

## 2020-12-08 ENCOUNTER — Other Ambulatory Visit: Payer: Self-pay

## 2020-12-08 DIAGNOSIS — A5901 Trichomonal vulvovaginitis: Secondary | ICD-10-CM | POA: Insufficient documentation

## 2020-12-08 DIAGNOSIS — Z7984 Long term (current) use of oral hypoglycemic drugs: Secondary | ICD-10-CM | POA: Insufficient documentation

## 2020-12-08 DIAGNOSIS — I10 Essential (primary) hypertension: Secondary | ICD-10-CM | POA: Insufficient documentation

## 2020-12-08 DIAGNOSIS — Z79899 Other long term (current) drug therapy: Secondary | ICD-10-CM | POA: Insufficient documentation

## 2020-12-08 DIAGNOSIS — N898 Other specified noninflammatory disorders of vagina: Secondary | ICD-10-CM | POA: Diagnosis not present

## 2020-12-08 DIAGNOSIS — E119 Type 2 diabetes mellitus without complications: Secondary | ICD-10-CM | POA: Insufficient documentation

## 2020-12-08 LAB — URINALYSIS, ROUTINE W REFLEX MICROSCOPIC
Bilirubin Urine: NEGATIVE
Glucose, UA: >=500 mg/dL — AB
Ketones, ur: NEGATIVE mg/dL
Nitrite: NEGATIVE
Protein, ur: NEGATIVE mg/dL
Specific Gravity, Urine: 1.015 (ref 1.005–1.030)
pH: 6 (ref 5.0–8.0)

## 2020-12-08 LAB — URINALYSIS, MICROSCOPIC (REFLEX)

## 2020-12-08 LAB — WET PREP, GENITAL
Clue Cells Wet Prep HPF POC: NONE SEEN
Sperm: NONE SEEN
Yeast Wet Prep HPF POC: NONE SEEN

## 2020-12-08 MED ORDER — METRONIDAZOLE 500 MG PO TABS: 500.0000 mg | ORAL_TABLET | Freq: Two times a day (BID) | ORAL | 0 refills | Status: DC

## 2020-12-08 NOTE — ED Provider Notes (Signed)
[facility] EMERGENCY DEPARTMENT Provider Note   CSN: [id number] Arrival date & time: 12/08/20  1003     History Chief Complaint  Patient presents with  . Vaginal Discharge    Priscilla Solis is a 50 y.o. female.  HPI Patient presents with vaginal discharge.  Has had for the last couple weeks.  Has been treated by PCP with Diflucan but had not had exam.  States it cleared up a little then returned.  States there is no smell.  No pain.  Just at discharge.  No dysuria.  States since she had her hysterectomy a couple years ago has had recurrent infections.  She is diabetic.  States her husband has not been treated.  States she has had positive for yeast and has been positive for trichomoniasis in the past.  Had a hysterectomy but still has her ovaries.    Past Medical History:  Diagnosis Date  . Allergy   . Blood transfusion without reported diagnosis    2016  . Diabetes mellitus without complication ([place])   . Environmental allergies   . Fibroid   . GERD (gastroesophageal reflux disease)   . Hypertension   . Hypokalemia 10/03/2013  . Microcytic anemia 10/03/2013  . Sickle cell anemia ([facility])    sickle cell trait    Patient Active Problem List   Diagnosis Date Noted  . Menorrhagia 09/07/2014  . Barrett's esophagus 07/09/2014  . Anemia, iron deficiency 05/15/2014  . Rectal bleeding 05/15/2014  . Gastroesophageal reflux disease without esophagitis 05/15/2014  . Microcytic anemia 10/03/2013  . Hypokalemia 10/03/2013    Past Surgical History:  Procedure Laterality Date  . ABDOMINAL HYSTERECTOMY  2017  . CHOLECYSTECTOMY     2019  . COLONOSCOPY    . NASAL SEPTUM SURGERY    . PALATE / UVULA BIOPSY / EXCISION    . POLYPECTOMY    . TONSILLECTOMY AND ADENOIDECTOMY    . UPPER GASTROINTESTINAL ENDOSCOPY       OB History    Gravida  1   Para  1   Term  1   Preterm      AB      Living  1     SAB      IAB      Ectopic      Multiple      Live  Births              Family History  Problem Relation Age of Onset  . Cancer Mother        Non invasive breast ca  . Breast cancer Mother   . Cancer Cousin        ovarian cancer  . Diabetes Maternal Aunt   . Diabetes Maternal Uncle   . Colitis Neg Hx   . Colon cancer Neg Hx   . Esophageal cancer Neg Hx   . Rectal cancer Neg Hx   . Stomach cancer Neg Hx     Social History   Tobacco Use  . Smoking status: Never Smoker  . Smokeless tobacco: Never Used  Vaping Use  . Vaping Use: Never used  Substance Use Topics  . Alcohol use: No    Alcohol/week: 0.0 standard drinks  . Drug use: No    Home Medications Prior to Admission medications   Medication Sig Start Date End Date Taking? Authorizing Provider  metroNIDAZOLE (FLAGYL) 500 MG tablet Take 1 tablet (500 mg total) by mouth 2 (two) times daily. 12/08/20  Yes Stevens Magwood,  Ovid Curd, MD  cetirizine (ZYRTEC) 10 MG tablet Take 10 mg by mouth daily.    [provider]  Cholecalciferol (VITAMIN D-1000 MAX ST) 1000 units tablet Take by mouth.    [provider]  cyclobenzaprine (FLEXERIL) 10 MG tablet Take 1 tablet (10 mg total) by mouth 2 (two) times daily as needed for muscle spasms. 04/27/18   Mortis, Alvie Heidelberg I, PA-C  Dulaglutide 1.5 MG/0.5ML SOPN Inject into the skin. 08/23/17   [provider]  empagliflozin (JARDIANCE) 25 MG TABS tablet Take by mouth. 10/26/17   [provider]  ethynodiol-ethinyl estradiol Celso Sickle) 1-35 MG-MCG tablet Take by mouth.    [provider]  fluticasone (FLONASE) 50 MCG/ACT nasal spray USE 2 SPRAYS IN EACH NOSTRIL DAILY 07/01/17   [provider]  folic acid (FOLVITE) 1 MG tablet TAKE 1 TABLET BY MOUTH DAILY. 03/29/16   [provider]  glimepiride (AMARYL) 1 MG tablet Take 1 mg by mouth.    [provider]  hydrochlorothiazide (HYDRODIURIL) 25 MG tablet Take 25 mg by mouth as needed (during menstrual cycle).  05/17/14 05/17/15   [provider]  Lancets MISC Test blood glucose 3 times daily and PRN 12/26/15   [provider]  losartan (COZAAR) 50 MG tablet Take 25 mg by mouth daily.    [provider]  metFORMIN (GLUCOPHAGE) 1000 MG tablet Take by mouth. 10/26/17   [provider]  oxyCODONE-acetaminophen (PERCOCET) 10-325 MG per tablet  09/08/13   [provider]  pantoprazole (PROTONIX) 40 MG tablet Take 1 tablet (40 mg total) by mouth daily. Pharmacy-please d/c rx for famotidine 02/20/19   Pyrtle, Lajuan Lines, MD  potassium chloride SA (K-DUR,KLOR-CON) 20 MEQ tablet Take 20 mEq by mouth as needed.  10/03/13   Heath Lark, MD  simvastatin (ZOCOR) 10 MG tablet Take 10 mg by mouth daily.    [provider]    Allergies    Patient has no known allergies.  Review of Systems   Review of Systems  Constitutional: Negative for appetite change.  HENT: Negative for dental problem.   Cardiovascular: Negative for chest pain.  Gastrointestinal: Negative for abdominal pain.  Genitourinary: Positive for vaginal discharge. Negative for difficulty urinating, dyspareunia, dysuria, menstrual problem, vaginal bleeding and vaginal pain.  Musculoskeletal: Negative for back pain.  Skin: Negative for rash.  Neurological: Negative for weakness.    Physical Exam Updated Vital Signs BP 108/64 (BP Location: Right Arm)   Pulse 82   Temp 98.8 F (37.1 C) (Oral)   Resp 16   Ht 5\' 7"  (1.702 m)   Wt 102.1 kg   LMP 11/23/2014 (Exact Date)   SpO2 98%   BMI 35.24 kg/m   Physical Exam Vitals and nursing note reviewed.  HENT:     Head: Normocephalic.  Eyes:     General: No scleral icterus.    Pupils: Pupils are equal, round, and reactive to light.  Cardiovascular:     Rate and Rhythm: Regular rhythm.  Pulmonary:     Breath sounds: No wheezing, rhonchi or rales.  Abdominal:     Tenderness: There is no abdominal tenderness.  Genitourinary:    Comments: Pelvic exam showed some thick  white discharge.  No cervix.  No tenderness. Musculoskeletal:        General: No tenderness.  Skin:    Capillary Refill: Capillary refill takes less than 2 seconds.  Neurological:     Mental Status: She is alert and oriented to person, place,  and time.     ED Results / Procedures / Treatments   Labs (all labs ordered are listed, but only abnormal results are displayed) Labs Reviewed  WET PREP, GENITAL - Abnormal; Notable for the following components:      Result Value   Trich, Wet Prep PRESENT (*)    WBC, Wet Prep HPF POC MANY (*)    All other components within normal limits  URINALYSIS, ROUTINE W REFLEX MICROSCOPIC - Abnormal; Notable for the following components:   Color, Urine STRAW (*)    Glucose, UA >=500 (*)    Hgb urine dipstick TRACE (*)    Leukocytes,Ua SMALL (*)    All other components within normal limits  URINALYSIS, MICROSCOPIC (REFLEX) - Abnormal; Notable for the following components:   Bacteria, UA FEW (*)    Trichomonas, UA PRESENT (*)    All other components within normal limits    EKG None  Radiology No results found.  Procedures Procedures   Medications Ordered in ED Medications - No data to display  ED Course  I have reviewed the triage vital signs and the nursing notes.  Pertinent labs & imaging results that were available during my care of the patient were reviewed by me and considered in my medical decision making (see chart for details).    MDM Rules/Calculators/A&P                          Patient with vaginal discharge.  Previous hysterectomy with cervix.  Wet prep done and showed potentially trichomoniasis.  States she has had this in the past.  Husband has not been treated.  Will treat patient.  Needs follow-up with PCP or GYN and potentially needs partner treated.  No yeast seen  Final Clinical Impression(s) / ED Diagnoses Final diagnoses:  Vaginal discharge  Trichomonal vaginitis    Rx / DC Orders ED Discharge Orders          Ordered    metroNIDAZOLE (FLAGYL) 500 MG tablet  2 times daily        12/08/20 1317           Davonna Belling, MD 12/08/20 1322

## 2020-12-08 NOTE — Discharge Instructions (Signed)
Take the Flagyl for 7 days.  Follow-up with either your PCP or potentially GYN for the recurrent trich infections.

## 2020-12-08 NOTE — ED Triage Notes (Signed)
Pt c/o vaginal discharge x couple of weeks. Pt has history of hysterectomy and having discharge ever since. Pt tried diflucan with little relief.

## 2020-12-12 MED FILL — FOLIC ACID 1 MG TABS: 1 | 90 days supply | Qty: 90 | Fill #0

## 2020-12-12 MED FILL — JARDIANCE 25 MG TABLET: 25 | 90 days supply | Qty: 90 | Fill #0

## 2020-12-12 MED FILL — LOSARTAN POTASSIUM 25 MG TA: 25 | 90 days supply | Qty: 90 | Fill #0

## 2020-12-27 ENCOUNTER — Encounter: Payer: Self-pay | Admitting: *Deleted

## 2021-01-01 MED FILL — OXYCODONE-APAP 10-325: 10-325 | 20 days supply | Qty: 120 | Fill #0

## 2021-01-02 ENCOUNTER — Other Ambulatory Visit (INDEPENDENT_AMBULATORY_CARE_PROVIDER_SITE_OTHER): Payer: 59

## 2021-01-02 ENCOUNTER — Other Ambulatory Visit: Payer: Self-pay

## 2021-01-02 ENCOUNTER — Ambulatory Visit (INDEPENDENT_AMBULATORY_CARE_PROVIDER_SITE_OTHER): Payer: 59 | Admitting: Internal Medicine

## 2021-01-02 ENCOUNTER — Other Ambulatory Visit: Payer: Self-pay | Admitting: Internal Medicine

## 2021-01-02 ENCOUNTER — Encounter: Payer: Self-pay | Admitting: Internal Medicine

## 2021-01-02 VITALS — BP 88/62 | HR 93 | Ht 67.0 in | Wt 238.0 lb

## 2021-01-02 DIAGNOSIS — R112 Nausea with vomiting, unspecified: Secondary | ICD-10-CM

## 2021-01-02 DIAGNOSIS — R109 Unspecified abdominal pain: Secondary | ICD-10-CM

## 2021-01-02 DIAGNOSIS — K219 Gastro-esophageal reflux disease without esophagitis: Secondary | ICD-10-CM | POA: Diagnosis not present

## 2021-01-02 MED ORDER — ONDANSETRON 4 MG PO TBDP: 4.0000 mg | ORAL_TABLET | Freq: Four times a day (QID) | ORAL | 1 refills | Status: DC | PRN

## 2021-01-02 MED ORDER — DICYCLOMINE HCL 20 MG PO TABS: 20.0000 mg | ORAL_TABLET | Freq: Three times a day (TID) | ORAL | 1 refills | Status: DC | PRN

## 2021-01-02 NOTE — Patient Instructions (Signed)
Your provider has requested that you go to the basement level for lab work before leaving today. Press "B" on the elevator. The lab is located at the first door on the left as you exit the elevator.  Continue famotidine 20 mg daily.  We have sent the following medications to your pharmacy for you to pick up at your convenience: Bentyl 20 mg three times daily as needed. Zofran 4 mcg ODT- 1 tablet every 6 hours as needed for nausea  You have been scheduled for a CT enteroscopy scan of the abdomen and pelvis at [facility] ([address]---this is in the same building as [facility] [employer]).   You are scheduled on 01/10/21 at 3:00 pm. You should arrive at 1:45 pm for registration. Please follow the written instructions below on the day of your exam:  WARNING: IF YOU ARE ALLERGIC TO IODINE/X-RAY DYE, PLEASE NOTIFY RADIOLOGY IMMEDIATELY AT [phone]! YOU WILL BE GIVEN A 13 HOUR PREMEDICATION PREP.  1) Do not eat or drink anything after 11:00 am (4 hours prior to your test) 2) You have been given 2 bottles of oral contrast to drink. The solution may taste better if refrigerated, but do NOT add ice or any other liquid to this solution. Shake well before drinking.  You may take any medications as prescribed with a small amount of water, if necessary. If you take any of the following medications: METFORMIN, GLUCOPHAGE, GLUCOVANCE, AVANDAMET, RIOMET, FORTAMET, ACTOPLUS MET, JANUMET, GLUMETZA or METAGLIP, you MAY be asked to HOLD this medication 48 hours AFTER the exam.  This test typically takes 30-45 minutes to complete.  If you have any questions regarding your exam or if you need to reschedule, you may call the CT department at [phone] between the hours of 8:00 am and 5:00 pm, Monday-Friday.  ________________________________________________________________  WHEN YOU BECOME SYMPTOMATIC, please come to the xray department of [facility] (basement floor) for 2  view abdominal xray. Orders are already in the computer. Our radiology department is open from Monday-Friday, 8 am-5 pm.

## 2021-01-02 NOTE — Progress Notes (Signed)
Subjective:    Patient ID: Priscilla Solis, female    DOB: 1970-11-03, 50 y.o.   MRN: [id number]  HPI Priscilla Solis is a 50 year old female with a past medical history of GERD, remote Barrett's esophagus (not present at EGD in 2019), prior IDA subsequently resolved, diabetes, hypertension, prior uterine fibroids status post hysterectomy in 2017, prior gallbladder removal who is seen to evaluate intermittent abdominal pain associated with loose stools, nausea and vomiting.  She is here alone today.  She was last seen in the office at the time of her last upper endoscopy in November 2018.  She reports that she has had 3 discrete episodes over the last 2 months which have been similar and concerning.  All 3 episodes started over the course of the night and woke her from sleep initially with the urge for bowel movement.  She heard increased borborygmi and had discomfort in her mid abdomen on both sides.  She felt the need to have a bowel movement but initially did not necessarily have a stool.  She tried to reposition herself in the bed by lying on her stomach and this progressed to loose stools and over the course of several hours of eventually increasing abdominal pressure until she developed severe nausea and vomiting.  Vomiting would often relieve the pain though after an episode for 1 to 3 days she would feel sore and "like I had been in a fight".  On one occasion her stools were dark but she had eaten Oreos and felt like this may have been related.  No visible blood or recent melena.  Outside of these episodes her bowel movements have been regular.  She has been taking famotidine 20 mg twice daily which controls her heartburn.  No dysphagia or odynophagia.  She does often have loose stools or a flushing type bowel movement after her weekly Ozempic dose.  She does not have constipation.  2 of the above mentioned episodes lasted 12 hours with the third episode lasting nearly 48 hours.  No new  medications.   Review of Systems As per HPI, otherwise negative  Current Medications, Allergies, Past Medical History, Past Surgical History, Family History and Social History were reviewed in [employer] [employer] record.     Objective:   Physical Exam BP (!) 88/62   Pulse 93   Ht '5\' 7"'  (1.702 m)   Wt 238 lb (108 kg)   LMP 11/23/2014 (Exact Date)   SpO2 98%   BMI 37.28 kg/m  Gen: awake, alert, NAD HEENT: anicteric  CV: RRR, no mrg Pulm: CTA b/l Abd: soft, obese, NT/ND, +BS throughout Ext: no c/c/e Neuro: nonfocal  Labs from care everywhere dated 08/06/2020: Hemoglobin 14.3, MCV 79.1, platelet count 286, white count 8.4 AST 18, ALT 14, alk phos 79, total bili 0.8, electrolytes normal, creatinine 0.76 Hep C antibody negative Ferritin 72 B12 1118    Assessment & Plan:  50 year old female with a past medical history of GERD, remote Barrett's esophagus (not present at EGD in 2019), prior IDA subsequently resolved, diabetes, hypertension, prior uterine fibroids status post hysterectomy in 2017, prior gallbladder removal who is seen to evaluate intermittent abdominal pain associated with loose stools, nausea and vomiting.   1.  Episodic abdominal pain with nausea vomiting and loose stool --given the 3 episodes this argues against an infectious etiology and raises the question of intermittent partial bowel obstruction.  She does have a history of a complicated hysterectomy and also has had prior cholecystectomy which  could predispose to abdominal adhesive disease.  I recommended the following: --CT enterography of the abdomen pelvis --Bentyl 20 mg 3 times daily as needed abdominal crampy pain --Zofran 4 mg ODT every 6 hours as needed nausea --Standing 2 view abdominal x-ray if another attack occurs  2.  GERD with history of Barrett's --she had short segment Barrett's in 2015 though no Barrett's by upper endoscopy in 2019.  No esophageal symptoms and reflux is well  controlled on twice daily famotidine --Continue famotidine 20 mg twice daily  3.  CRC screening --only a distal hyperplastic polyp at colonoscopy, she would be due for repeat colonoscopy 10 years after her last which would be August 2025  30 minutes total spent today including patient facing time, coordination of care, reviewing medical history/procedures/pertinent radiology studies, and documentation of the encounter.

## 2021-01-03 LAB — CREATININE, SERUM: Creatinine, Ser: 0.97 mg/dL (ref 0.40–1.20)

## 2021-01-03 LAB — BUN: BUN: 11 mg/dL (ref 6–23)

## 2021-01-04 ENCOUNTER — Other Ambulatory Visit (HOSPITAL_BASED_OUTPATIENT_CLINIC_OR_DEPARTMENT_OTHER): Payer: Self-pay

## 2021-01-04 MED FILL — Ondansetron Orally Disintegrating Tab 4 MG: ORAL | 10 days supply | Qty: 40 | Fill #0 | Status: CN

## 2021-01-04 MED FILL — Dicyclomine HCl Tab 20 MG: ORAL | 30 days supply | Qty: 90 | Fill #0 | Status: CN

## 2021-01-06 ENCOUNTER — Telehealth: Payer: Self-pay | Admitting: Internal Medicine

## 2021-01-06 NOTE — Telephone Encounter (Signed)
Left voicemail advising patient that we did resend scripts of dicyclomine and zofran to [facility] prior to her leaving her appointment on 01/02/21. I asked that she contact me if [facility] tells her they indeed did not get the scripts, and I will contact them and get those in for her.

## 2021-01-06 NOTE — Telephone Encounter (Signed)
Inbound call from patient stating medications that were prescribed to her from appt on 01/02/21 need to be called into [facility] because they were initially sent to [facility].  Please advise.

## 2021-01-08 ENCOUNTER — Other Ambulatory Visit (HOSPITAL_BASED_OUTPATIENT_CLINIC_OR_DEPARTMENT_OTHER): Payer: Self-pay

## 2021-01-10 ENCOUNTER — Ambulatory Visit (INDEPENDENT_AMBULATORY_CARE_PROVIDER_SITE_OTHER)
Admission: RE | Admit: 2021-01-10 | Discharge: 2021-01-10 | Disposition: A | Discharge status: Home / Self Care | Payer: 59 | Source: Ambulatory Visit | Attending: Internal Medicine | Admitting: Internal Medicine

## 2021-01-10 ENCOUNTER — Other Ambulatory Visit: Payer: Self-pay

## 2021-01-10 DIAGNOSIS — R112 Nausea with vomiting, unspecified: Secondary | ICD-10-CM | POA: Diagnosis not present

## 2021-01-10 DIAGNOSIS — R109 Unspecified abdominal pain: Secondary | ICD-10-CM

## 2021-01-10 DIAGNOSIS — R1110 Vomiting, unspecified: Secondary | ICD-10-CM | POA: Diagnosis not present

## 2021-01-10 MED ORDER — IOHEXOL 300 MG/ML  SOLN
100.0000 mL | Freq: Once | INTRAMUSCULAR | Status: AC | PRN
  Administered 2021-01-10: 100 mL via INTRAVENOUS

## 2021-01-13 ENCOUNTER — Other Ambulatory Visit (HOSPITAL_BASED_OUTPATIENT_CLINIC_OR_DEPARTMENT_OTHER): Payer: Self-pay

## 2021-01-13 MED ORDER — OZEMPIC (1 MG/DOSE) 4 MG/3ML ~~LOC~~ SOPN
PEN_INJECTOR | SUBCUTANEOUS | 1 refills | Status: DC
  Filled 2021-01-13 – 2021-01-15 (×2): qty 3, 28d supply, fill #0
  Filled 2021-02-05 (×2): qty 3, 28d supply, fill #1

## 2021-01-15 ENCOUNTER — Other Ambulatory Visit (HOSPITAL_BASED_OUTPATIENT_CLINIC_OR_DEPARTMENT_OTHER): Payer: Self-pay

## 2021-01-15 MED FILL — Dicyclomine HCl Tab 20 MG: ORAL | 30 days supply | Qty: 90 | Fill #0 | Status: AC

## 2021-01-15 MED FILL — Ondansetron Orally Disintegrating Tab 4 MG: ORAL | 10 days supply | Qty: 40 | Fill #0 | Status: AC

## 2021-01-22 ENCOUNTER — Other Ambulatory Visit (HOSPITAL_COMMUNITY)
Admission: RE | Admit: 2021-01-22 | Discharge: 2021-01-22 | Disposition: A | Discharge status: Home / Self Care | Payer: 59 | Source: Ambulatory Visit | Attending: Family Medicine | Admitting: Family Medicine

## 2021-01-22 ENCOUNTER — Ambulatory Visit (INDEPENDENT_AMBULATORY_CARE_PROVIDER_SITE_OTHER): Payer: 59 | Admitting: Family Medicine

## 2021-01-22 ENCOUNTER — Encounter: Payer: Self-pay | Admitting: Family Medicine

## 2021-01-22 ENCOUNTER — Other Ambulatory Visit: Payer: Self-pay

## 2021-01-22 VITALS — BP 132/65 | HR 82 | Ht 67.0 in | Wt 236.0 lb

## 2021-01-22 DIAGNOSIS — Z01419 Encounter for gynecological examination (general) (routine) without abnormal findings: Secondary | ICD-10-CM | POA: Diagnosis not present

## 2021-01-22 DIAGNOSIS — N898 Other specified noninflammatory disorders of vagina: Secondary | ICD-10-CM | POA: Insufficient documentation

## 2021-01-22 NOTE — Progress Notes (Signed)
GYNECOLOGY ANNUAL PREVENTATIVE CARE ENCOUNTER NOTE  Subjective:   Priscilla Solis is a 50 y.o. G5P1001 female here for a routine annual gynecologic exam.  Current complaints: gets recurrent trichomonas infections once a year around spring time. Happens once a year for the past 3-4 years. States that both she and her husband are monogamous. Last diagnosed on 3/6. Treated with flagyl 500mg  BID x7 days. Husband also treated. Has not been sexually active since last infection.  Denies abnormal vaginal bleeding, discharge, pelvic pain, problems with intercourse or other gynecologic concerns.    S/p hysterectomy for menorrhagia necessitating blood transfusion.  Gynecologic History Patient's last menstrual period was 11/23/2014 (exact date). Patient is sexually active  Contraception: status post hysterectomy Last mammogram: 05/2020 (At [facility]. Results reviewed in [place]. Results were: normal  Obstetric History OB History  Gravida Para Term Preterm AB Living  1 1 1     1   SAB IAB Ectopic Multiple Live Births               # Outcome Date GA Lbr Len/2nd Weight Sex Delivery Anes PTL Lv  1 Term             Past Medical History:  Diagnosis Date  . Allergy   . Barrett esophagus   . Blood transfusion without reported diagnosis    2016  . Diabetes mellitus without complication ([place])   . Environmental allergies   . Fibroid   . GERD (gastroesophageal reflux disease)   . Hypertension   . Hypokalemia 10/03/2013  . IDA (iron deficiency anemia)   . Microcytic anemia 10/03/2013  . Sickle cell anemia ([facility])    sickle cell trait  . Trichomoniasis     Past Surgical History:  Procedure Laterality Date  . ABDOMINAL HYSTERECTOMY  2017  . CHOLECYSTECTOMY     2019  . COLONOSCOPY    . NASAL SEPTUM SURGERY    . PALATE / UVULA BIOPSY / EXCISION    . POLYPECTOMY    . TONSILLECTOMY AND ADENOIDECTOMY    . UPPER GASTROINTESTINAL ENDOSCOPY      Current Outpatient Medications on File  Prior to Visit  Medication Sig Dispense Refill  . cetirizine (ZYRTEC) 10 MG tablet Take 10 mg by mouth daily.    . Cholecalciferol 25 MCG (1000 UT) tablet Take by mouth.    . dicyclomine (BENTYL) 20 MG tablet TAKE 1 TABLET BY MOUTH THREE TIMES DAILY AS NEEDED FOR SPASMS 90 tablet 1  . empagliflozin (JARDIANCE) 25 MG TABS tablet TAKE 1 TABLET BY MOUTH ONCE DAILY 90 tablet 4  . famotidine (PEPCID) 10 MG tablet Take 10 mg by mouth 2 (two) times daily.    . fluticasone (FLONASE) 50 MCG/ACT nasal spray as needed.    . folic acid (FOLVITE) 1 MG tablet TAKE 1 TABLET BY MOUTH DAILY.    Marland Kitchen Lancets MISC Test blood glucose 3 times daily and PRN    . losartan (COZAAR) 25 MG tablet TAKE 1 TABLET BY MOUTH ONCE DAILY 90 tablet 1  . metFORMIN (GLUCOPHAGE) 1000 MG tablet TAKE 1 TABLET BY MOUTH TWICE DAILY WITH MEALS 180 tablet 4  . ondansetron (ZOFRAN-ODT) 4 MG disintegrating tablet TAKE 1 TABLET BY MOUTH EVERY 6 HOURS AS NEEDED FOR NAUSEA & VOMITING 40 tablet 1  . oxyCODONE-acetaminophen (PERCOCET) 10-325 MG tablet TAKE 1 TABLET BY MOUTH EVERY FOUR (4) HOURS AS NEEDED FOR PAIN. 120 tablet 0  . Semaglutide, 1 MG/DOSE, (OZEMPIC, 1 MG/DOSE,) 4 MG/3ML SOPN INJECT 1 MG  UNDER THE SKIN EVERY 7 DAYS 3 mL 1  . simvastatin (ZOCOR) 40 MG tablet TAKE 1 TABLET (40 MG TOTAL) BY MOUTH NIGHTLY. 90 tablet 3   No current facility-administered medications on file prior to visit.    Allergies  Allergen Reactions  . Other Swelling    "Cats"    Social History   Socioeconomic History  . Marital status: Single    Spouse name: Not on file  . Number of children: 1  . Years of education: Not on file  . Highest education level: Not on file  Occupational History  . Occupation: Training and development officer: [employer]  Tobacco Use  . Smoking status: Never Smoker  . Smokeless tobacco: Never Used  Vaping Use  . Vaping Use: Never used  Substance and Sexual Activity  . Alcohol use: No    Alcohol/week: 0.0 standard drinks   . Drug use: No  . Sexual activity: Yes    Partners: Male    Birth control/protection: None  Other Topics Concern  . Not on file  Social History Narrative  . Not on file   Social Determinants of Health   Financial Resource Strain: Not on file  Food Insecurity: Not on file  Transportation Needs: Not on file  Physical Activity: Not on file  Stress: Not on file  Social Connections: Not on file  Intimate Partner Violence: Not on file    Family History  Problem Relation Age of Onset  . Cancer Mother        Non invasive breast ca  . Breast cancer Mother   . Cancer Cousin        ovarian cancer  . Diabetes Maternal Aunt   . Diabetes Maternal Uncle   . Colitis Neg Hx   . Colon cancer Neg Hx   . Esophageal cancer Neg Hx   . Rectal cancer Neg Hx   . Stomach cancer Neg Hx     The following portions of the patient's history were reviewed and updated as appropriate: allergies, current medications, past family history, past medical history, past social history, past surgical history and problem list.  Review of Systems Pertinent items are noted in HPI.   Objective:  BP 132/65   Pulse 82   Ht 5\' 7"  (1.702 m)   Wt 236 lb (107 kg)   LMP 11/23/2014 (Exact Date)   BMI 36.96 kg/m  Wt Readings from Last 3 Encounters:  01/22/21 236 lb (107 kg)  01/02/21 238 lb (108 kg)  12/08/20 225 lb (102.1 kg)     Chaperone present during exam  CONSTITUTIONAL: Well-developed, well-nourished female in no acute distress.  HENT:  Normocephalic, atraumatic, External right and left ear normal. Oropharynx is clear and moist EYES: Conjunctivae and EOM are normal. Pupils are equal, round, and reactive to light. No scleral icterus.  NECK: Normal range of motion, supple, no masses.  Normal thyroid.   CARDIOVASCULAR: Normal heart rate noted, regular rhythm RESPIRATORY: Clear to auscultation bilaterally. Effort and breath sounds normal, no problems with respiration noted. BREASTS: deferred. ABDOMEN:  Soft, normal bowel sounds, no distention noted.  No tenderness, rebound or guarding.  PELVIC: Normal appearing external genitalia; normal appearing vaginal mucosa and cervix.  Thick white discharge.  Absent uterus MUSCULOSKELETAL: Normal range of motion. No tenderness.  No cyanosis, clubbing, or edema.  2+ distal pulses. SKIN: Skin is warm and dry. No rash noted. Not diaphoretic. No erythema. No pallor. NEUROLOGIC: Alert and oriented to person, place, and  time. Normal reflexes, muscle tone coordination. No cranial nerve deficit noted. PSYCHIATRIC: Normal mood and affect. Normal behavior. Normal judgment and thought content.  Assessment:  Annual gynecologic examination with pap smear   Plan:  1. Well Woman Exam Mammogram to be scheduled in August.  2. Vaginal discharge ? Antibiotic resistance vs recurrent infection. TOC today.   Routine preventative health maintenance measures emphasized. Please refer to After Visit Summary for other counseling recommendations.    Loma Boston, [place] for [employer]

## 2021-01-23 ENCOUNTER — Other Ambulatory Visit (HOSPITAL_BASED_OUTPATIENT_CLINIC_OR_DEPARTMENT_OTHER): Payer: Self-pay

## 2021-01-23 LAB — CERVICOVAGINAL ANCILLARY ONLY
Bacterial Vaginitis (gardnerella): NEGATIVE
Candida Glabrata: POSITIVE — AB
Candida Vaginitis: NEGATIVE
Chlamydia: NEGATIVE
Comment: NEGATIVE
Comment: NEGATIVE
Comment: NEGATIVE
Comment: NEGATIVE
Comment: NEGATIVE
Comment: NORMAL
Neisseria Gonorrhea: NEGATIVE
Trichomonas: NEGATIVE

## 2021-01-23 MED ORDER — FLUCONAZOLE 150 MG PO TABS
150.0000 mg | ORAL_TABLET | Freq: Once | ORAL | 3 refills | Status: AC
  Filled 2021-01-23 – 2021-01-31 (×2): qty 1, 1d supply, fill #0
  Filled 2021-04-29: qty 1, 1d supply, fill #1

## 2021-01-23 NOTE — Addendum Note (Signed)
Addended by: Truett Mainland on: 01/23/2021 12:06 PM   Modules accepted: Orders

## 2021-01-30 ENCOUNTER — Other Ambulatory Visit (HOSPITAL_BASED_OUTPATIENT_CLINIC_OR_DEPARTMENT_OTHER): Payer: Self-pay

## 2021-01-30 MED ORDER — OXYCODONE-ACETAMINOPHEN 10-325 MG PO TABS
ORAL_TABLET | ORAL | 0 refills | Status: AC
  Filled 2021-01-30: qty 120, 30d supply, fill #0
  Filled 2021-01-31: qty 120, 20d supply, fill #0

## 2021-01-31 ENCOUNTER — Other Ambulatory Visit (HOSPITAL_BASED_OUTPATIENT_CLINIC_OR_DEPARTMENT_OTHER): Payer: Self-pay

## 2021-02-05 ENCOUNTER — Other Ambulatory Visit (HOSPITAL_BASED_OUTPATIENT_CLINIC_OR_DEPARTMENT_OTHER): Payer: Self-pay

## 2021-02-05 MED ORDER — VITAMIN D3 125 MCG (5000 UT) PO CAPS
1.0000 | ORAL_CAPSULE | Freq: Every day | ORAL | 0 refills | Status: AC
  Filled 2021-02-05: qty 100, 100d supply, fill #0

## 2021-02-06 ENCOUNTER — Other Ambulatory Visit (HOSPITAL_BASED_OUTPATIENT_CLINIC_OR_DEPARTMENT_OTHER): Payer: Self-pay

## 2021-02-07 ENCOUNTER — Other Ambulatory Visit (HOSPITAL_BASED_OUTPATIENT_CLINIC_OR_DEPARTMENT_OTHER): Payer: Self-pay

## 2021-02-18 ENCOUNTER — Other Ambulatory Visit (HOSPITAL_BASED_OUTPATIENT_CLINIC_OR_DEPARTMENT_OTHER): Payer: Self-pay

## 2021-02-18 DIAGNOSIS — R809 Proteinuria, unspecified: Secondary | ICD-10-CM | POA: Diagnosis not present

## 2021-02-18 DIAGNOSIS — G44329 Chronic post-traumatic headache, not intractable: Secondary | ICD-10-CM | POA: Diagnosis not present

## 2021-02-18 DIAGNOSIS — Z6837 Body mass index (BMI) 37.0-37.9, adult: Secondary | ICD-10-CM | POA: Diagnosis not present

## 2021-02-18 DIAGNOSIS — I10 Essential (primary) hypertension: Secondary | ICD-10-CM | POA: Diagnosis not present

## 2021-02-18 DIAGNOSIS — Z79899 Other long term (current) drug therapy: Secondary | ICD-10-CM | POA: Diagnosis not present

## 2021-02-18 DIAGNOSIS — E1129 Type 2 diabetes mellitus with other diabetic kidney complication: Secondary | ICD-10-CM | POA: Diagnosis not present

## 2021-02-18 DIAGNOSIS — J4520 Mild intermittent asthma, uncomplicated: Secondary | ICD-10-CM | POA: Diagnosis not present

## 2021-02-18 MED ORDER — OXYCODONE-ACETAMINOPHEN 10-325 MG PO TABS
ORAL_TABLET | ORAL | 0 refills | Status: DC
  Filled 2021-03-31: qty 120, 20d supply, fill #0

## 2021-02-18 MED ORDER — OXYCODONE-ACETAMINOPHEN 10-325 MG PO TABS
ORAL_TABLET | ORAL | 0 refills | Status: DC
  Filled 2021-04-30: qty 120, 20d supply, fill #0

## 2021-02-18 MED ORDER — OXYCODONE-ACETAMINOPHEN 10-325 MG PO TABS
ORAL_TABLET | ORAL | 0 refills | Status: DC
  Filled 2021-02-27: qty 120, 30d supply, fill #0

## 2021-02-26 ENCOUNTER — Other Ambulatory Visit (HOSPITAL_BASED_OUTPATIENT_CLINIC_OR_DEPARTMENT_OTHER): Payer: Self-pay

## 2021-02-27 ENCOUNTER — Other Ambulatory Visit (HOSPITAL_BASED_OUTPATIENT_CLINIC_OR_DEPARTMENT_OTHER): Payer: Self-pay

## 2021-03-01 ENCOUNTER — Other Ambulatory Visit (HOSPITAL_BASED_OUTPATIENT_CLINIC_OR_DEPARTMENT_OTHER): Payer: Self-pay

## 2021-03-04 ENCOUNTER — Other Ambulatory Visit (HOSPITAL_BASED_OUTPATIENT_CLINIC_OR_DEPARTMENT_OTHER): Payer: Self-pay

## 2021-03-04 MED ORDER — OZEMPIC (1 MG/DOSE) 4 MG/3ML ~~LOC~~ SOPN
PEN_INJECTOR | SUBCUTANEOUS | 1 refills | Status: DC
  Filled 2021-03-04: qty 3, 28d supply, fill #0
  Filled 2021-03-30: qty 3, 28d supply, fill #1

## 2021-03-05 ENCOUNTER — Other Ambulatory Visit (HOSPITAL_BASED_OUTPATIENT_CLINIC_OR_DEPARTMENT_OTHER): Payer: Self-pay

## 2021-03-09 MED FILL — Empagliflozin Tab 25 MG: ORAL | 90 days supply | Qty: 90 | Fill #0 | Status: CN

## 2021-03-10 ENCOUNTER — Other Ambulatory Visit (HOSPITAL_BASED_OUTPATIENT_CLINIC_OR_DEPARTMENT_OTHER): Payer: Self-pay

## 2021-03-16 MED FILL — Metformin HCl Tab 1000 MG: ORAL | 90 days supply | Qty: 180 | Fill #0 | Status: CN

## 2021-03-17 ENCOUNTER — Other Ambulatory Visit (HOSPITAL_BASED_OUTPATIENT_CLINIC_OR_DEPARTMENT_OTHER): Payer: Self-pay

## 2021-03-17 ENCOUNTER — Other Ambulatory Visit (HOSPITAL_COMMUNITY): Payer: Self-pay

## 2021-03-17 MED FILL — Metformin HCl Tab 1000 MG: ORAL | 90 days supply | Qty: 180 | Fill #0 | Status: AC

## 2021-03-17 MED FILL — Empagliflozin Tab 25 MG: ORAL | 90 days supply | Qty: 90 | Fill #0 | Status: AC

## 2021-03-30 MED FILL — Losartan Potassium Tab 25 MG: ORAL | 90 days supply | Qty: 90 | Fill #0 | Status: AC

## 2021-03-31 ENCOUNTER — Other Ambulatory Visit (HOSPITAL_BASED_OUTPATIENT_CLINIC_OR_DEPARTMENT_OTHER): Payer: Self-pay

## 2021-03-31 MED ORDER — FOLIC ACID 1 MG PO TABS
1.0000 mg | ORAL_TABLET | Freq: Every day | ORAL | 7 refills | Status: AC
  Filled 2021-03-31: qty 90, 90d supply, fill #0

## 2021-04-01 ENCOUNTER — Other Ambulatory Visit (HOSPITAL_BASED_OUTPATIENT_CLINIC_OR_DEPARTMENT_OTHER): Payer: Self-pay

## 2021-04-02 ENCOUNTER — Other Ambulatory Visit (HOSPITAL_BASED_OUTPATIENT_CLINIC_OR_DEPARTMENT_OTHER): Payer: Self-pay

## 2021-04-14 ENCOUNTER — Other Ambulatory Visit (HOSPITAL_BASED_OUTPATIENT_CLINIC_OR_DEPARTMENT_OTHER): Payer: Self-pay

## 2021-04-14 ENCOUNTER — Other Ambulatory Visit (HOSPITAL_COMMUNITY): Payer: Self-pay

## 2021-04-14 MED FILL — Simvastatin Tab 40 MG: ORAL | 90 days supply | Qty: 90 | Fill #0 | Status: AC

## 2021-04-15 ENCOUNTER — Other Ambulatory Visit (HOSPITAL_BASED_OUTPATIENT_CLINIC_OR_DEPARTMENT_OTHER): Payer: Self-pay

## 2021-04-15 MED ORDER — OZEMPIC (1 MG/DOSE) 4 MG/3ML ~~LOC~~ SOPN
PEN_INJECTOR | SUBCUTANEOUS | 1 refills | Status: DC
  Filled 2021-04-15 – 2021-04-21 (×2): qty 6, 56d supply, fill #0

## 2021-04-16 ENCOUNTER — Other Ambulatory Visit (HOSPITAL_BASED_OUTPATIENT_CLINIC_OR_DEPARTMENT_OTHER): Payer: Self-pay

## 2021-04-17 ENCOUNTER — Other Ambulatory Visit (HOSPITAL_BASED_OUTPATIENT_CLINIC_OR_DEPARTMENT_OTHER): Payer: Self-pay

## 2021-04-21 ENCOUNTER — Other Ambulatory Visit (HOSPITAL_BASED_OUTPATIENT_CLINIC_OR_DEPARTMENT_OTHER): Payer: Self-pay

## 2021-04-24 ENCOUNTER — Other Ambulatory Visit (HOSPITAL_BASED_OUTPATIENT_CLINIC_OR_DEPARTMENT_OTHER): Payer: Self-pay

## 2021-04-29 ENCOUNTER — Other Ambulatory Visit (HOSPITAL_BASED_OUTPATIENT_CLINIC_OR_DEPARTMENT_OTHER): Payer: Self-pay

## 2021-04-29 MED FILL — Ondansetron Orally Disintegrating Tab 4 MG: ORAL | 10 days supply | Qty: 40 | Fill #1 | Status: AC

## 2021-04-30 ENCOUNTER — Other Ambulatory Visit (HOSPITAL_BASED_OUTPATIENT_CLINIC_OR_DEPARTMENT_OTHER): Payer: Self-pay

## 2021-05-27 ENCOUNTER — Other Ambulatory Visit (HOSPITAL_BASED_OUTPATIENT_CLINIC_OR_DEPARTMENT_OTHER): Payer: Self-pay

## 2021-05-28 ENCOUNTER — Other Ambulatory Visit (HOSPITAL_BASED_OUTPATIENT_CLINIC_OR_DEPARTMENT_OTHER): Payer: Self-pay

## 2021-05-28 MED ORDER — OXYCODONE-ACETAMINOPHEN 10-325 MG PO TABS
ORAL_TABLET | ORAL | 0 refills | Status: AC
  Filled 2021-05-28: qty 120, 20d supply, fill #0

## 2021-06-04 ENCOUNTER — Other Ambulatory Visit (HOSPITAL_BASED_OUTPATIENT_CLINIC_OR_DEPARTMENT_OTHER): Payer: Self-pay

## 2021-06-10 ENCOUNTER — Other Ambulatory Visit (HOSPITAL_BASED_OUTPATIENT_CLINIC_OR_DEPARTMENT_OTHER): Payer: Self-pay

## 2021-06-11 ENCOUNTER — Other Ambulatory Visit (HOSPITAL_BASED_OUTPATIENT_CLINIC_OR_DEPARTMENT_OTHER): Payer: Self-pay

## 2021-06-11 MED ORDER — METFORMIN HCL 1000 MG PO TABS
ORAL_TABLET | ORAL | 3 refills | Status: AC
  Filled 2021-06-11: qty 180, 90d supply, fill #0

## 2021-06-11 MED ORDER — JARDIANCE 25 MG PO TABS
25.0000 mg | ORAL_TABLET | Freq: Every day | ORAL | 3 refills | Status: AC
  Filled 2021-06-11: qty 90, 90d supply, fill #0

## 2021-06-11 MED ORDER — LOSARTAN POTASSIUM 25 MG PO TABS
25.0000 mg | ORAL_TABLET | Freq: Every day | ORAL | 3 refills | Status: AC
  Filled 2021-06-11: qty 90, 90d supply, fill #0

## 2021-06-11 MED ORDER — OZEMPIC (1 MG/DOSE) 4 MG/3ML ~~LOC~~ SOPN: PEN_INJECTOR | SUBCUTANEOUS | 3 refills | Status: AC

## 2021-06-11 MED ORDER — HYDROCHLOROTHIAZIDE 12.5 MG PO TABS
12.5000 mg | ORAL_TABLET | Freq: Every day | ORAL | 3 refills | Status: AC
  Filled 2021-06-11: qty 90, 90d supply, fill #0

## 2021-06-12 ENCOUNTER — Other Ambulatory Visit (HOSPITAL_BASED_OUTPATIENT_CLINIC_OR_DEPARTMENT_OTHER): Payer: Self-pay

## 2021-06-13 ENCOUNTER — Other Ambulatory Visit (HOSPITAL_BASED_OUTPATIENT_CLINIC_OR_DEPARTMENT_OTHER): Payer: Self-pay

## 2021-06-20 ENCOUNTER — Other Ambulatory Visit (HOSPITAL_BASED_OUTPATIENT_CLINIC_OR_DEPARTMENT_OTHER): Payer: Self-pay

## 2021-06-27 ENCOUNTER — Other Ambulatory Visit (HOSPITAL_BASED_OUTPATIENT_CLINIC_OR_DEPARTMENT_OTHER): Payer: Self-pay

## 2021-07-11 ENCOUNTER — Other Ambulatory Visit: Payer: Self-pay

## 2021-07-31 ENCOUNTER — Other Ambulatory Visit (HOSPITAL_COMMUNITY): Payer: Self-pay

## 2021-11-11 ENCOUNTER — Encounter: Payer: Self-pay | Admitting: General Practice

## 2021-12-17 ENCOUNTER — Telehealth: Payer: Self-pay | Admitting: Internal Medicine

## 2021-12-17 NOTE — Telephone Encounter (Signed)
Inbound call from patient stating that she is having issues with burning in her throat. Seeking advice if she needs to go back on her medication. Please advise.  ?

## 2021-12-17 NOTE — Telephone Encounter (Signed)
Left message for pt to call back  °

## 2021-12-18 NOTE — Telephone Encounter (Signed)
Left message for pt to call back. ? ?Pt ate a [employer] fry and had terrible heartburn after. She doubled up on her pepcid and now the symptoms are beter. Dicussed with pt that she could continue to double up for a few days and then go back to pepcid '10mg'$  bid. She knows to call back if she continues to have issues. ?

## 2022-01-13 ENCOUNTER — Other Ambulatory Visit: Payer: Self-pay

## 2022-07-26 IMAGING — CT CT ENTEROGRAPHY (ABD-PELV W/ CM)
2 of 6 series · 16 of 46 positions shown, 18 images · IV contrast (omnipaque)
Comparison: 06/07/2013

CLINICAL DATA: Intermittent epigastric abdominal pain with
vomiting. History of Barrett's esophagus. Evaluate for
intermittent/partial small bowel obstruction. Cholecystectomy and
hysterectomy.

EXAM:
CT ABDOMEN AND PELVIS WITH CONTRAST (ENTEROGRAPHY)
TECHNIQUE: Multidetector CT of the abdomen and pelvis during bolus
administration of intravenous contrast. Negative oral contrast was
given.
CONTRAST:  100mL OMNIPAQUE IOHEXOL 300 MG/ML  SOLN

[Series 4: entero thins · axial · 0.86mm/px · z∈[-485,-5]mm · 13 of 270 slices shown, 15 images]
[im 15/270  soft-tissue]
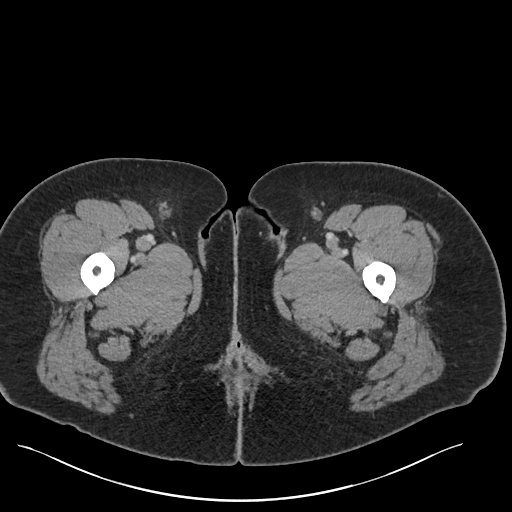
[im 15/270  bone]
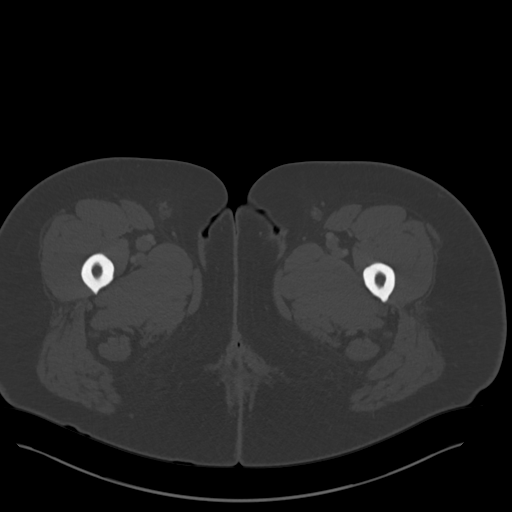
[im 43/270  soft-tissue]
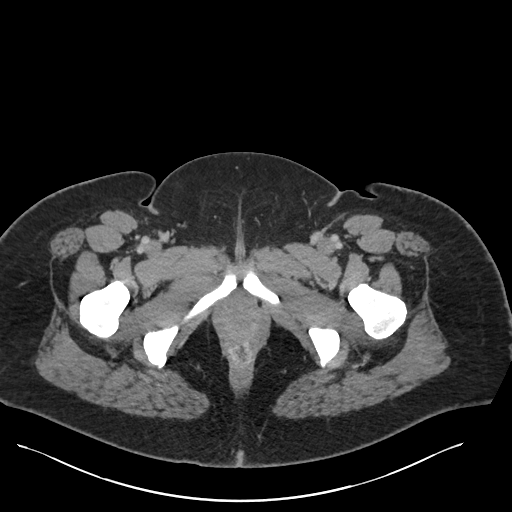
[im 57/270  soft-tissue]
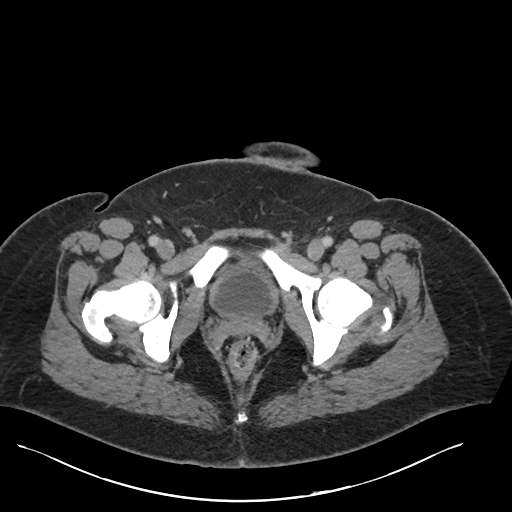
[im 71/270  soft-tissue]
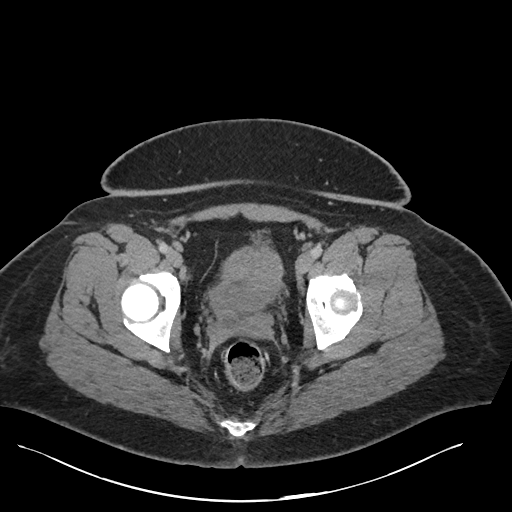
[im 100/270  soft-tissue]
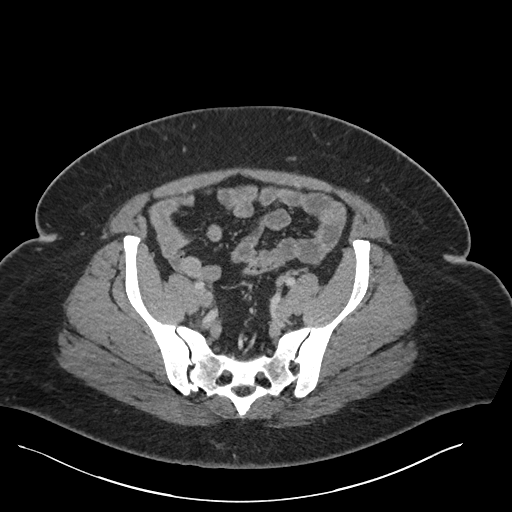
[im 114/270  soft-tissue]
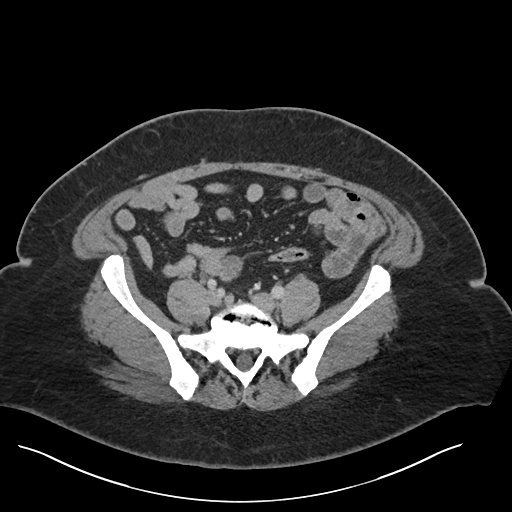
[im 142/270  soft-tissue]
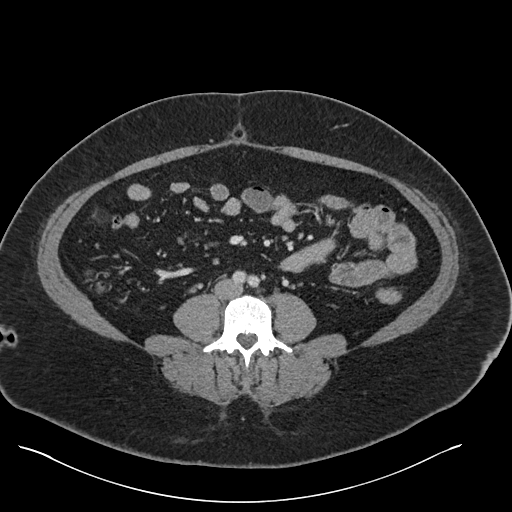
[im 156/270  soft-tissue]
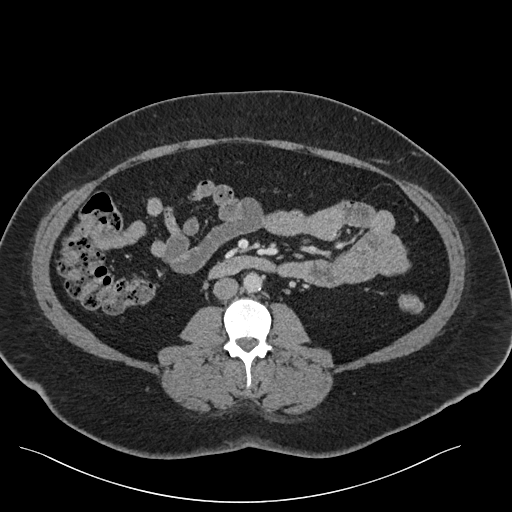
[im 170/270  soft-tissue]
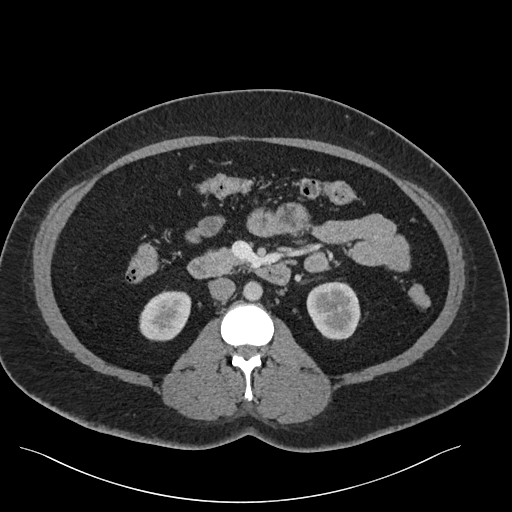
[im 170/270  bone]
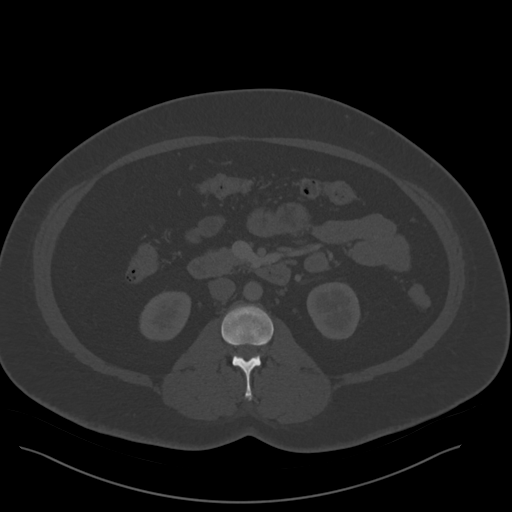
[im 199/270  soft-tissue]
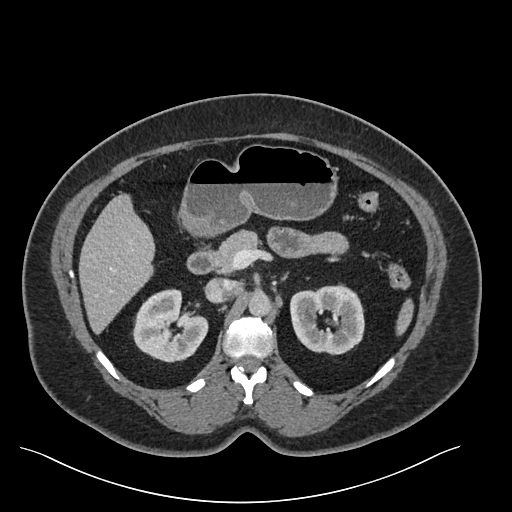
[im 213/270  soft-tissue]
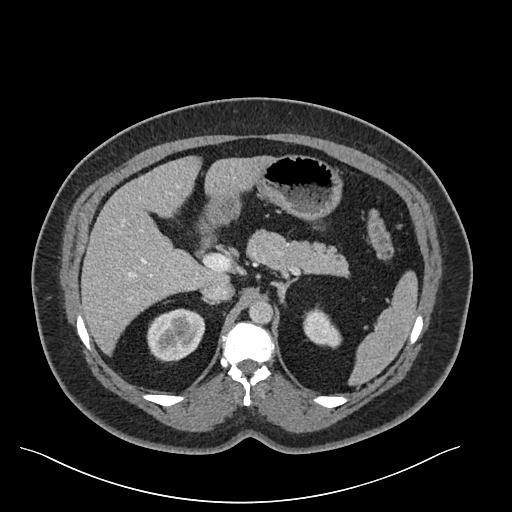
[im 227/270  soft-tissue]
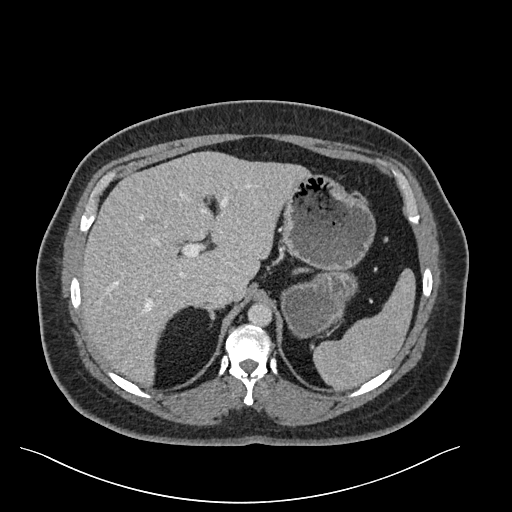
[im 255/270  soft-tissue]
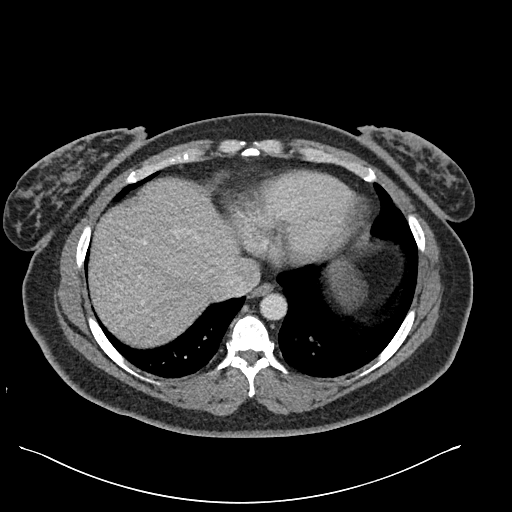

[Series 6: coronal · coronal · 0.81mm/px · 3 of 84 slices shown]
[im 28/84  soft-tissue]
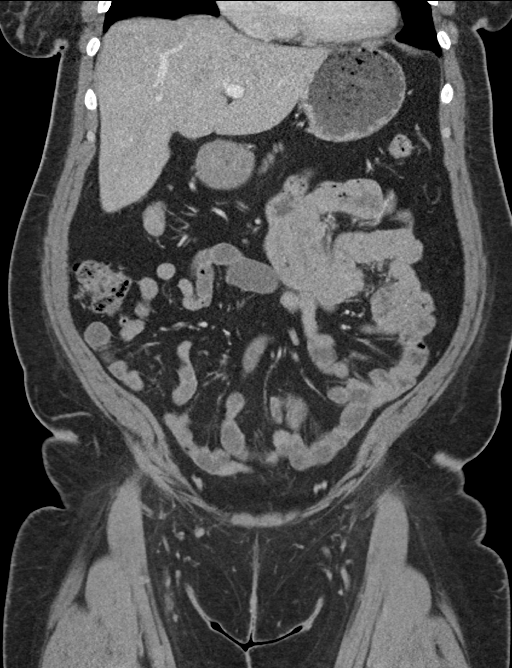
[im 37/84  soft-tissue]
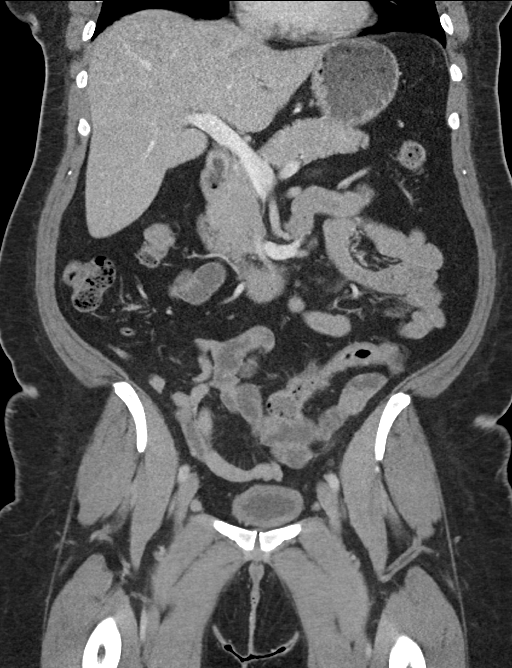
[im 47/84  soft-tissue]
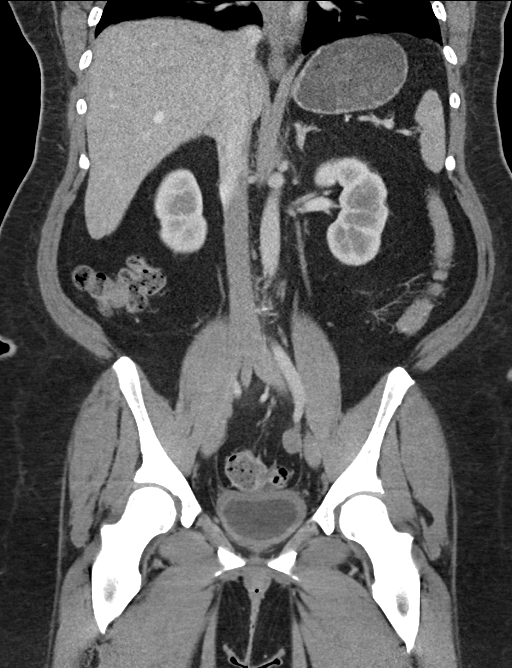

[16 of 46 positions shown; findings below may reference images not displayed]

FINDINGS: Lower chest: Clear lung bases. Normal heart size without pericardial
or pleural effusion.

Hepatobiliary: Normal liver. Cholecystectomy, without biliary ductal
dilatation.

Pancreas: Normal, without mass or ductal dilatation.

Spleen: Normal in size, without focal abnormality.

Adrenals/Urinary Tract: Normal adrenal glands. Normal kidneys,
without hydronephrosis. The bladder appears mildly thick walled
including on 83/2. It is however, incompletely distended.

Stomach/Bowel: Gastric antral underdistention, including on [DATE] and
[DATE].

Normal colon and appendix.

The extent of small bowel distension with neutral contrast is
moderate.

Normal terminal ileum, including on 116/4.

No small bowel obstruction, inflammation, stricture.

Vascular/Lymphatic: Normal caliber of the aorta and branch vessels.
No abdominopelvic adenopathy.

Reproductive: Hysterectomy.  No adnexal mass.

Other: No significant free fluid.

Musculoskeletal: Degenerative disc disease at the lumbosacral
junction.
IMPRESSION: 1. No evidence of bowel obstruction or small bowel pathology.
2. Gastric antral underdistention. Cannot exclude wall thickening.
If gastritis is a clinical consideration, consider endoscopy.
3. Apparent bladder wall thickening may also be due to incomplete
distension. This could be correlated with urinalysis to exclude
cystitis.

## 2022-09-22 ENCOUNTER — Other Ambulatory Visit (HOSPITAL_BASED_OUTPATIENT_CLINIC_OR_DEPARTMENT_OTHER): Payer: Self-pay

## 2022-09-22 ENCOUNTER — Telehealth: Payer: Self-pay | Admitting: Internal Medicine

## 2022-09-22 MED ORDER — ONDANSETRON 4 MG PO TBDP
4.0000 mg | ORAL_TABLET | Freq: Four times a day (QID) | ORAL | 0 refills | Status: AC | PRN
  Filled 2022-09-22: qty 40, 10d supply, fill #0

## 2022-09-22 NOTE — Telephone Encounter (Signed)
The patient called back and would like her pharmacy on file changed to [facility] due to changing her insurance.

## 2022-09-22 NOTE — Telephone Encounter (Signed)
Patient is calling states she in out of her Zofran Rx. Please advise

## 2022-09-22 NOTE — Telephone Encounter (Signed)
Updated pharmacy in system

## 2022-09-22 NOTE — Telephone Encounter (Signed)
Rx sent. Will need office visit for additional refills. Pt was last seen 2022.
